# Patient Record
Sex: Female | Born: 1955
Health system: Southern US, Community
[De-identification: ages and names within clinical notes are randomized; demographics above are authoritative.]

## PROBLEM LIST (undated history)

## (undated) DIAGNOSIS — M549 Dorsalgia, unspecified: Secondary | ICD-10-CM

## (undated) DIAGNOSIS — E079 Disorder of thyroid, unspecified: Secondary | ICD-10-CM

## (undated) DIAGNOSIS — T7840XA Allergy, unspecified, initial encounter: Secondary | ICD-10-CM

## (undated) DIAGNOSIS — K219 Gastro-esophageal reflux disease without esophagitis: Secondary | ICD-10-CM

## (undated) DIAGNOSIS — E039 Hypothyroidism, unspecified: Secondary | ICD-10-CM

## (undated) DIAGNOSIS — M8580 Other specified disorders of bone density and structure, unspecified site: Secondary | ICD-10-CM

## (undated) DIAGNOSIS — I10 Essential (primary) hypertension: Secondary | ICD-10-CM

## (undated) DIAGNOSIS — M810 Age-related osteoporosis without current pathological fracture: Secondary | ICD-10-CM

## (undated) DIAGNOSIS — J45909 Unspecified asthma, uncomplicated: Secondary | ICD-10-CM

## (undated) DIAGNOSIS — E785 Hyperlipidemia, unspecified: Secondary | ICD-10-CM

## (undated) HISTORY — DX: Hyperlipidemia, unspecified: E78.5

## (undated) HISTORY — PX: UPPER GASTROINTESTINAL ENDOSCOPY: SHX188

## (undated) HISTORY — PX: COLONOSCOPY: SHX174

## (undated) HISTORY — DX: Disorder of thyroid, unspecified: E07.9

## (undated) HISTORY — DX: Dorsalgia, unspecified: M54.9

## (undated) HISTORY — PX: NASAL SINUS SURGERY: SHX719

## (undated) HISTORY — DX: Other specified disorders of bone density and structure, unspecified site: M85.80

## (undated) HISTORY — DX: Unspecified asthma, uncomplicated: J45.909

## (undated) HISTORY — PX: DILATION AND CURETTAGE OF UTERUS: SHX78

## (undated) HISTORY — DX: Essential (primary) hypertension: I10

## (undated) HISTORY — DX: Allergy, unspecified, initial encounter: T78.40XA

## (undated) HISTORY — DX: Age-related osteoporosis without current pathological fracture: M81.0

## (undated) HISTORY — PX: FRACTURE SURGERY: SHX138

## (undated) HISTORY — DX: Gastro-esophageal reflux disease without esophagitis: K21.9

---

## 1999-10-30 HISTORY — PX: ABDOMINAL HYSTERECTOMY: SHX81

## 2000-02-06 ENCOUNTER — Other Ambulatory Visit: Admission: RE | Admit: 2000-02-06 | Discharge: 2000-02-06 | Payer: Self-pay | Admitting: Family Medicine

## 2000-03-08 ENCOUNTER — Other Ambulatory Visit: Admission: RE | Admit: 2000-03-08 | Discharge: 2000-03-08 | Payer: Self-pay | Admitting: Gynecology

## 2000-04-18 ENCOUNTER — Other Ambulatory Visit: Admission: RE | Admit: 2000-04-18 | Discharge: 2000-04-18 | Payer: Self-pay | Admitting: Gynecology

## 2001-06-11 ENCOUNTER — Other Ambulatory Visit: Admission: RE | Admit: 2001-06-11 | Discharge: 2001-06-11 | Payer: Self-pay | Admitting: Family Medicine

## 2002-09-25 ENCOUNTER — Other Ambulatory Visit: Admission: RE | Admit: 2002-09-25 | Discharge: 2002-09-25 | Payer: Self-pay | Admitting: Family Medicine

## 2003-09-02 ENCOUNTER — Other Ambulatory Visit: Admission: RE | Admit: 2003-09-02 | Discharge: 2003-09-02 | Payer: Self-pay | Admitting: Family Medicine

## 2006-09-12 ENCOUNTER — Other Ambulatory Visit: Admission: RE | Admit: 2006-09-12 | Discharge: 2006-09-12 | Payer: Self-pay | Admitting: Family Medicine

## 2006-10-04 ENCOUNTER — Ambulatory Visit: Payer: Self-pay | Admitting: Gastroenterology

## 2006-10-11 ENCOUNTER — Ambulatory Visit: Payer: Self-pay | Admitting: Gastroenterology

## 2013-02-17 ENCOUNTER — Ambulatory Visit (INDEPENDENT_AMBULATORY_CARE_PROVIDER_SITE_OTHER): Payer: PRIVATE HEALTH INSURANCE | Admitting: Nurse Practitioner

## 2013-02-17 ENCOUNTER — Encounter: Payer: Self-pay | Admitting: Nurse Practitioner

## 2013-02-17 VITALS — BP 131/77 | HR 66 | Temp 98.3°F | Ht 68.5 in | Wt 177.0 lb

## 2013-02-17 DIAGNOSIS — E039 Hypothyroidism, unspecified: Secondary | ICD-10-CM

## 2013-02-17 DIAGNOSIS — E785 Hyperlipidemia, unspecified: Secondary | ICD-10-CM

## 2013-02-17 DIAGNOSIS — I10 Essential (primary) hypertension: Secondary | ICD-10-CM

## 2013-02-17 LAB — COMPLETE METABOLIC PANEL WITH GFR
ALT: 20 U/L (ref 0–35)
AST: 19 U/L (ref 0–37)
Alkaline Phosphatase: 72 U/L (ref 39–117)
CO2: 27 mEq/L (ref 19–32)
Creat: 0.75 mg/dL (ref 0.50–1.10)
GFR, Est African American: >89 mL/min
Sodium: 140 mEq/L (ref 135–145)
Total Bilirubin: 0.4 mg/dL (ref 0.3–1.2)
Total Protein: 7.2 g/dL (ref 6.0–8.3)

## 2013-02-17 LAB — THYROID PANEL WITH TSH
T3 Uptake: 30.9 % (ref 22.5–37.0)
T4, Total: 11.4 ug/dL (ref 5.0–12.5)
TSH: 1.75 u[IU]/mL (ref 0.350–4.500)

## 2013-02-17 MED ORDER — LEVOTHYROXINE SODIUM 50 MCG PO TABS: 50.0000 ug | ORAL_TABLET | Freq: Every day | ORAL | Status: DC

## 2013-02-17 NOTE — Patient Instructions (Signed)

## 2013-02-17 NOTE — Progress Notes (Signed)
Subjective:     Natalie Valencia is a 57 y.o. female.  Chief Complaint: Dyslipidemia Patient presents for evaluation of lipids. Compliance with treatment thus far has been good. Currently on no meds, just trying diet control. A repeat fasting lipid profile was done. The patient does not use medications that may worsen dyslipidemias (corticosteroids, progestins, anabolic steroids, diuretics, beta-blockers, amiodarone, cyclosporine, olanzapine). The patient exercises rarely.  The patient is not known to have coexisting coronary artery disease.   Cardiac Risk Factors Age > 45-female, > 55-female:  YES  +1  Smoking:   NO  Sig. family hx of CHD*:  NO  Hypertension:   YES  +1  Diabetes:   NO  HDL < 35:   NO  HDL > 59:   YES  -1  Total: 1  *- Significant family history of Coronary Heart Disease per National Cholesterol Education Program = Myocardial Infarction or sudden death at less than 91 years old in  father or other 1st-degree female relative, or less than 21 years old in mother or  other 1st-degree female relative.  Hypertension Patient is here for follow-up of elevated blood pressure. She is not exercising and is adherent to a low-salt diet. Blood pressure is well controlled at home. Cardiac symptoms: none. Patient denies chest pain, dyspnea, fatigue, irregular heart beat, palpitations and syncope. Cardiovascular risk factors: dyslipidemia and obesity (BMI >= 30 kg/m2). Use of agents associated with hypertension: none. History of target organ damage: none. Hypothyroidism Natalie Valencia is a 57 y.o. female who presents for follow up of hypothyroidism. Current symptoms: none . Patient denies diarrhea, heat / cold intolerance, nervousness and weight changes. Symptoms have essentially resolved. The following portions of the patient's history were reviewed and updated as appropriate: allergies, current medications, past family history, past medical history, past surgical history and problem  list.  Review of Systems Pertinent items are noted in HPI.    Objective:   BP 131/77  Pulse 66  Temp(Src) 98.3 F (36.8 C) (Oral)  Ht 5' 8.5" (1.74 m)  Wt 177 lb (80.287 kg)  BMI 26.52 kg/m2  General Appearance:    Alert, cooperative, no distress, appears stated age  Head:    Normocephalic, without obvious abnormality, atraumatic  Eyes:    PERRL, conjunctiva/corneas clear, EOM's intact, fundi    benign, both eyes  Ears:    Normal TM's and external ear canals, both ears  Nose:   Nares normal, septum midline, mucosa normal, no drainage    or sinus tenderness  Throat:   Lips, mucosa, and tongue normal; teeth and gums normal  Neck:   Supple, symmetrical, trachea midline, no adenopathy;    thyroid:  no enlargement/tenderness/nodules; no carotid   bruit or JVD  Back:     Symmetric, no curvature, ROM normal, no CVA tenderness  Lungs:     Clear to auscultation bilaterally, respirations unlabored  Chest Wall:    No tenderness or deformity   Heart:    Regular rate and rhythm, S1 and S2 normal, no murmur, rub   or gallop  Breast Exam:    No tenderness, masses, or nipple abnormality  Abdomen:     Soft, non-tender, bowel sounds active all four quadrants,    no masses, no organomegaly        Extremities:   Extremities normal, atraumatic, no cyanosis or edema  Pulses:   2+ and symmetric all extremities  Skin:   Skin color, texture, turgor normal, no rashes or lesions  Lymph nodes:  Cervical, supraclavicular, and axillary nodes normal  Neurologic:   CNII-XII intact, normal strength, sensation and reflexes    throughout      Assessment:     1. Essential hypertension, benign   2. Hyperlipidemia with target LDL less than 100   3. Unspecified hypothyroidism         Plan:      Orders Placed This Encounter  Procedures  . COMPLETE METABOLIC PANEL WITH GFR  . NMR Lipoprofile with Lipids  . Thyroid Panel With TSH        Meds ordered this encounter  Medications  . DISCONTD:  levothyroxine (SYNTHROID, LEVOTHROID) 50 MCG tablet    Sig: Take 50 mcg by mouth daily before breakfast.  . Cholecalciferol (VITAMIN D3) 2000 UNITS TABS    Sig: Take 2,000 Units by mouth daily.  . Calcium-Vitamin D-Vitamin K (CALCIUM + D) 9201987106-40 MG-UNT-MCG CHEW    Sig: Chew by mouth.  . Omega-3 Fatty Acids (FISH OIL) 500 MG CAPS    Sig: Take 1,000 mg by mouth daily.  Marland Kitchen levothyroxine (SYNTHROID, LEVOTHROID) 50 MCG tablet    Sig: Take 1 tablet (50 mcg total) by mouth daily before breakfast.    Dispense:  90 tablet    Refill:  1    Order Specific Question:  Supervising Provider    Answer:  Ernestina Penna [[id number]]  Diet and exercise encouraged Follow up in 6 months. Mary-Margaret Daphine Deutscher, FNP

## 2013-02-18 ENCOUNTER — Telehealth: Payer: Self-pay | Admitting: Nurse Practitioner

## 2013-02-18 LAB — NMR LIPOPROFILE WITH LIPIDS
Cholesterol, Total: 230 mg/dL — ABNORMAL HIGH (ref <200)
HDL Particle Number: 35.6 umol/L (ref >=30.5)
HDL-C: 76 mg/dL (ref >=40)
LDL (calc): 137 mg/dL — ABNORMAL HIGH (ref <100)
LDL Size: 21.1 nm (ref >20.5)
LP-IR Score: <25 (ref <=45)
Large HDL-P: 12.3 umol/L (ref >=4.8)
Triglycerides: 85 mg/dL (ref <150)

## 2013-02-19 NOTE — Telephone Encounter (Signed)
Calling back on labs patient is aware of them

## 2013-08-23 ENCOUNTER — Other Ambulatory Visit: Payer: Self-pay | Admitting: Nurse Practitioner

## 2013-10-02 ENCOUNTER — Encounter: Payer: Self-pay | Admitting: Family Medicine

## 2013-10-02 ENCOUNTER — Ambulatory Visit (INDEPENDENT_AMBULATORY_CARE_PROVIDER_SITE_OTHER): Payer: PRIVATE HEALTH INSURANCE | Admitting: Family Medicine

## 2013-10-02 VITALS — BP 138/72 | HR 76 | Temp 98.8°F | Ht 68.0 in | Wt 178.0 lb

## 2013-10-02 DIAGNOSIS — J209 Acute bronchitis, unspecified: Secondary | ICD-10-CM

## 2013-10-02 MED ORDER — HYDROCOD POLST-CHLORPHEN POLST 10-8 MG/5ML PO LQCR: 5.0000 mL | Freq: Two times a day (BID) | ORAL | Status: DC | PRN

## 2013-10-02 MED ORDER — AZITHROMYCIN 250 MG PO TABS: ORAL_TABLET | ORAL | Status: DC

## 2013-10-02 NOTE — Progress Notes (Signed)
   Subjective:    Patient ID: Natalie Valencia, female    DOB: [birth year], 57 y.o.   MRN: [id number]  HPI This 57 y.o. female presents for evaluation of cough.  She has been coughing A lot at night and she has hx of asthma and she has been using her SABA inhaler More recently.   Review of Systems No chest pain, SOB, HA, dizziness, vision change, N/V, diarrhea, constipation, dysuria, urinary urgency or frequency, myalgias, arthralgias or rash.     Objective:   Physical Exam Vital signs noted  Well developed well nourished female.  HEENT - Head atraumatic Normocephalic                Eyes - PERRLA, Conjuctiva - clear Sclera- Clear EOMI                Ears - EAC's Wnl TM's Wnl Gross Hearing WNL                Nose - Nares patent                 Throat - oropharanx wnl Respiratory - Lungs CTA bilateral Cardiac - RRR S1 and S2 without murmur Breasts- Normal bilateral GI - Abdomen soft Nontender and bowel sounds active x 4 Extremities - No edema. Neuro - Grossly intact.       Assessment & Plan:  Acute bronchitis - Plan: chlorpheniramine-HYDROcodone (TUSSIONEX PENNKINETIC ER) 10-8 MG/5ML LQCR, azithromycin (ZITHROMAX) 250 MG tablet  Deatra Canter FNP

## 2013-10-02 NOTE — Patient Instructions (Signed)
Hypertriglyceridemia  Diet for High blood levels of Triglycerides Most fats in food are triglycerides. Triglycerides in your blood are stored as fat in your body. High levels of triglycerides in your blood may put you at a greater risk for heart disease and stroke.  Normal triglyceride levels are less than 150 mg/dL. Borderline high levels are 150-199 mg/dl. High levels are 200 - 499 mg/dL, and very high triglyceride levels are greater than 500 mg/dL. The decision to treat high triglycerides is generally based on the level. For people with borderline or high triglyceride levels, treatment includes weight loss and exercise. Drugs are recommended for people with very high triglyceride levels. Many people who need treatment for high triglyceride levels have metabolic syndrome. This syndrome is a collection of disorders that often include: insulin resistance, high blood pressure, blood clotting problems, high cholesterol and triglycerides. TESTING PROCEDURE FOR TRIGLYCERIDES  You should not eat 4 hours before getting your triglycerides measured. The normal range of triglycerides is between 10 and 250 milligrams per deciliter (mg/dl). Some people may have extreme levels (1000 or above), but your triglyceride level may be too high if it is above 150 mg/dl, depending on what other risk factors you have for heart disease.  People with high blood triglycerides may also have high blood cholesterol levels. If you have high blood cholesterol as well as high blood triglycerides, your risk for heart disease is probably greater than if you only had high triglycerides. High blood cholesterol is one of the main risk factors for heart disease. CHANGING YOUR DIET  Your weight can affect your blood triglyceride level. If you are more than 20% above your ideal body weight, you may be able to lower your blood triglycerides by losing weight. Eating less and exercising regularly is the best way to combat this. Fat provides more  calories than any other food. The best way to lose weight is to eat less fat. Only 30% of your total calories should come from fat. Less than 7% of your diet should come from saturated fat. A diet low in fat and saturated fat is the same as a diet to decrease blood cholesterol. By eating a diet lower in fat, you may lose weight, lower your blood cholesterol, and lower your blood triglyceride level.  Eating a diet low in fat, especially saturated fat, may also help you lower your blood triglyceride level. Ask your dietitian to help you figure how much fat you can eat based on the number of calories your caregiver has prescribed for you.  Exercise, in addition to helping with weight loss may also help lower triglyceride levels.   Alcohol can increase blood triglycerides. You may need to stop drinking alcoholic beverages.  Too much carbohydrate in your diet may also increase your blood triglycerides. Some complex carbohydrates are necessary in your diet. These may include bread, rice, potatoes, other starchy vegetables and cereals.  Reduce "simple" carbohydrates. These may include pure sugars, candy, honey, and jelly without losing other nutrients. If you have the kind of high blood triglycerides that is affected by the amount of carbohydrates in your diet, you will need to eat less sugar and less high-sugar foods. Your caregiver can help you with this.  Adding 2-4 grams of fish oil (EPA+ DHA) may also help lower triglycerides. Speak with your caregiver before adding any supplements to your regimen. Following the Diet  Maintain your ideal weight. Your caregivers can help you with a diet. Generally, eating less food and getting more   exercise will help you lose weight. Joining a weight control group may also help. Ask your caregivers for a good weight control group in your area.  Eat low-fat foods instead of high-fat foods. This can help you lose weight too.  These foods are lower in fat. Eat MORE of these:    Dried beans, peas, and lentils.  Egg whites.  Low-fat cottage cheese.  Fish.  Lean cuts of meat, such as round, sirloin, rump, and flank (cut extra fat off meat you fix).  Whole grain breads, cereals and pasta.  Skim and nonfat dry milk.  Low-fat yogurt.  Poultry without the skin.  Cheese made with skim or part-skim milk, such as mozzarella, parmesan, farmers', ricotta, or pot cheese. These are higher fat foods. Eat LESS of these:   Whole milk and foods made from whole milk, such as American, blue, cheddar, monterey jack, and swiss cheese  High-fat meats, such as luncheon meats, sausages, knockwurst, bratwurst, hot dogs, ribs, corned beef, ground pork, and regular ground beef.  Fried foods. Limit saturated fats in your diet. Substituting unsaturated fat for saturated fat may decrease your blood triglyceride level. You will need to read package labels to know which products contain saturated fats.  These foods are high in saturated fat. Eat LESS of these:   Fried pork skins.  Whole milk.  Skin and fat from poultry.  Palm oil.  Butter.  Shortening.  Cream cheese.  Bacon.  Margarines and baked goods made from listed oils.  Vegetable shortenings.  Chitterlings.  Fat from meats.  Coconut oil.  Palm kernel oil.  Lard.  Cream.  Sour cream.  Fatback.  Coffee whiteners and non-dairy creamers made with these oils.  Cheese made from whole milk. Use unsaturated fats (both polyunsaturated and monounsaturated) moderately. Remember, even though unsaturated fats are better than saturated fats; you still want a diet low in total fat.  These foods are high in unsaturated fat:   Canola oil.  Sunflower oil.  Mayonnaise.  Almonds.  Peanuts.  Pine nuts.  Margarines made with these oils.  Safflower oil.  Olive oil.  Avocados.  Cashews.  Peanut butter.  Sunflower seeds.  Soybean oil.  Peanut  oil.  Olives.  Pecans.  Walnuts.  Pumpkin seeds. Avoid sugar and other high-sugar foods. This will decrease carbohydrates without decreasing other nutrients. Sugar in your food goes rapidly to your blood. When there is excess sugar in your blood, your liver may use it to make more triglycerides. Sugar also contains calories without other important nutrients.  Eat LESS of these:   Sugar, brown sugar, powdered sugar, jam, jelly, preserves, honey, syrup, molasses, pies, candy, cakes, cookies, frosting, pastries, colas, soft drinks, punches, fruit drinks, and regular gelatin.  Avoid alcohol. Alcohol, even more than sugar, may increase blood triglycerides. In addition, alcohol is high in calories and low in nutrients. Ask for sparkling water, or a diet soft drink instead of an alcoholic beverage. Suggestions for planning and preparing meals   Bake, broil, grill or roast meats instead of frying.  Remove fat from meats and skin from poultry before cooking.  Add spices, herbs, lemon juice or vinegar to vegetables instead of salt, rich sauces or gravies.  Use a non-stick skillet without fat or use no-stick sprays.  Cool and refrigerate stews and broth. Then remove the hardened fat floating on the surface before serving.  Refrigerate meat drippings and skim off fat to make low-fat gravies.  Serve more fish.  Use less butter,   margarine and other high-fat spreads on bread or vegetables.  Use skim or reconstituted non-fat dry milk for cooking.  Cook with low-fat cheeses.  Substitute low-fat yogurt or cottage cheese for all or part of the sour cream in recipes for sauces, dips or congealed salads.  Use half yogurt/half mayonnaise in salad recipes.  Substitute evaporated skim milk for cream. Evaporated skim milk or reconstituted non-fat dry milk can be whipped and substituted for whipped cream in certain recipes.  Choose fresh fruits for dessert instead of high-fat foods such as pies or  cakes. Fruits are naturally low in fat. When Dining Out   Order low-fat appetizers such as fruit or vegetable juice, pasta with vegetables or tomato sauce.  Select clear, rather than cream soups.  Ask that dressings and gravies be served on the side. Then use less of them.  Order foods that are baked, broiled, poached, steamed, stir-fried, or roasted.  Ask for margarine instead of butter, and use only a small amount.  Drink sparkling water, unsweetened tea or coffee, or diet soft drinks instead of alcohol or other sweet beverages. QUESTIONS AND ANSWERS ABOUT OTHER FATS IN THE BLOOD: SATURATED FAT, TRANS FAT, AND CHOLESTEROL What is trans fat? Trans fat is a type of fat that is formed when vegetable oil is hardened through a process called hydrogenation. This process helps makes foods more solid, gives them shape, and prolongs their shelf life. Trans fats are also called hydrogenated or partially hydrogenated oils.  What do saturated fat, trans fat, and cholesterol in foods have to do with heart disease? Saturated fat, trans fat, and cholesterol in the diet all raise the level of LDL "bad" cholesterol in the blood. The higher the LDL cholesterol, the greater the risk for coronary heart disease (CHD). Saturated fat and trans fat raise LDL similarly.  What foods contain saturated fat, trans fat, and cholesterol? High amounts of saturated fat are found in animal products, such as fatty cuts of meat, chicken skin, and full-fat dairy products like butter, whole milk, cream, and cheese, and in tropical vegetable oils such as palm, palm kernel, and coconut oil. Trans fat is found in some of the same foods as saturated fat, such as vegetable shortening, some margarines (especially hard or stick margarine), crackers, cookies, baked goods, fried foods, salad dressings, and other processed foods made with partially hydrogenated vegetable oils. Small amounts of trans fat also occur naturally in some animal  products, such as milk products, beef, and lamb. Foods high in cholesterol include liver, other organ meats, egg yolks, shrimp, and full-fat dairy products. How can I use the new food label to make heart-healthy food choices? Check the Nutrition Facts panel of the food label. Choose foods lower in saturated fat, trans fat, and cholesterol. For saturated fat and cholesterol, you can also use the Percent Daily Value (%DV): 5% DV or less is low, and 20% DV or more is high. (There is no %DV for trans fat.) Use the Nutrition Facts panel to choose foods low in saturated fat and cholesterol, and if the trans fat is not listed, read the ingredients and limit products that list shortening or hydrogenated or partially hydrogenated vegetable oil, which tend to be high in trans fat. POINTS TO REMEMBER:   Discuss your risk for heart disease with your caregivers, and take steps to reduce risk factors.  Change your diet. Choose foods that are low in saturated fat, trans fat, and cholesterol.  Add exercise to your daily routine if   it is not already being done. Participate in physical activity of moderate intensity, like brisk walking, for at least 30 minutes on most, and preferably all days of the week. No time? Break the 30 minutes into three, 10-minute segments during the day.  Stop smoking. If you do smoke, contact your caregiver to discuss ways in which they can help you quit.  Do not use street drugs.  Maintain a normal weight.  Maintain a healthy blood pressure.  Keep up with your blood work for checking the fats in your blood as directed by your caregiver. Document Released: 08/02/2004 Document Revised: 04/15/2012 Document Reviewed: 02/28/2009 [employer] Patient Information 2014 [employer].  

## 2013-10-09 ENCOUNTER — Ambulatory Visit (INDEPENDENT_AMBULATORY_CARE_PROVIDER_SITE_OTHER): Payer: PRIVATE HEALTH INSURANCE

## 2013-10-09 ENCOUNTER — Ambulatory Visit (INDEPENDENT_AMBULATORY_CARE_PROVIDER_SITE_OTHER): Payer: PRIVATE HEALTH INSURANCE | Admitting: Physician Assistant

## 2013-10-09 VITALS — BP 122/73 | HR 68 | Temp 98.0°F | Ht 68.0 in | Wt 177.8 lb

## 2013-10-09 DIAGNOSIS — R062 Wheezing: Secondary | ICD-10-CM

## 2013-10-09 DIAGNOSIS — J209 Acute bronchitis, unspecified: Secondary | ICD-10-CM

## 2013-10-09 DIAGNOSIS — J45909 Unspecified asthma, uncomplicated: Secondary | ICD-10-CM

## 2013-10-09 DIAGNOSIS — J45901 Unspecified asthma with (acute) exacerbation: Secondary | ICD-10-CM

## 2013-10-09 MED ORDER — ALBUTEROL SULFATE HFA 108 (90 BASE) MCG/ACT IN AERS: INHALATION_SPRAY | RESPIRATORY_TRACT | Status: DC

## 2013-10-09 MED ORDER — PREDNISONE 20 MG PO TABS: 20.0000 mg | ORAL_TABLET | Freq: Two times a day (BID) | ORAL | Status: DC

## 2013-10-09 MED ORDER — AZITHROMYCIN 250 MG PO TABS: ORAL_TABLET | ORAL | Status: DC

## 2013-10-09 MED ORDER — HYDROCOD POLST-CHLORPHEN POLST 10-8 MG/5ML PO LQCR: 5.0000 mL | Freq: Two times a day (BID) | ORAL | Status: DC | PRN

## 2013-10-09 NOTE — Patient Instructions (Signed)
Take plain over the counter musinex as directed. Drink plenty of fluids. Use inhaler every 4-6 hours for wheezing and shortness of breath. Take second dose of prednisone early in the evening to prevent trouble sleeping.

## 2013-10-12 NOTE — Progress Notes (Signed)
   Subjective:    Patient ID: Natalie Valencia, female    DOB: 02/29/1956, 57 y.o.   MRN: [id number]  HPI 57 y/o female presents s/p dx of acute bronchitis on 10/02/13. She finished her zpak on 10/07/13 and has had some relief. Still has chest congestion, difficulty taking a deep breath and cough at night.    Review of Systems  Constitutional: Negative.   HENT: Positive for congestion (chest). Negative for ear pain, nosebleeds, postnasal drip, rhinorrhea, sinus pressure, sneezing and sore throat.   Respiratory: Positive for cough (productive), chest tightness and wheezing. Negative for apnea.        Difficulty taking a deep breath  Cardiovascular: Negative.        Objective:   Physical Exam  Nursing note and vitals reviewed. Constitutional: She is oriented to person, place, and time. She appears well-developed and well-nourished. No distress.  HENT:  Right Ear: External ear normal.  Left Ear: External ear normal.  Mouth/Throat: Oropharynx is clear and moist. No oropharyngeal exudate.  Cardiovascular: Normal rate, regular rhythm and normal heart sounds.  Exam reveals no gallop and no friction rub.   No murmur heard. Pulmonary/Chest: Effort normal. No respiratory distress. She has wheezes (mild expiratory bilaterally ). She has no rales. She exhibits no tenderness.  Chest xray negative for acute abnormalities  Neurological: She is alert and oriented to person, place, and time. She has normal reflexes.  Skin: She is not diaphoretic.          Assessment & Plan:  1. Acute bacterial bronchitis: Since Patient is still symptomatic s/p antibiotic treatment, I will continue antibiotic x 5 more days, adding albuterol for relief of wheezing and Prednisone 20mg  BID for bronchiole inflammation. Advised patient to take plain OTC musinex and drink plenty of non-caffienated  fluids. Also refilled tussinex for relief of cough at bedtime. I will reassess in 1 wk.

## 2013-10-16 ENCOUNTER — Ambulatory Visit: Payer: PRIVATE HEALTH INSURANCE | Admitting: Physician Assistant

## 2013-11-23 ENCOUNTER — Other Ambulatory Visit: Payer: Self-pay | Admitting: Nurse Practitioner

## 2014-02-22 ENCOUNTER — Other Ambulatory Visit: Payer: Self-pay | Admitting: Family Medicine

## 2014-02-23 NOTE — Telephone Encounter (Signed)
Last seen 10/02/13  B [place]  Last thyroid level 02/17/13  Requesting a 90 day supply

## 2014-06-28 ENCOUNTER — Other Ambulatory Visit: Payer: Self-pay | Admitting: Family Medicine

## 2014-06-29 NOTE — Telephone Encounter (Signed)
no more refills without being seen  

## 2014-06-29 NOTE — Telephone Encounter (Signed)
Patient last seen in office on chronic follow up was 02-17-13. Two acute visits in 12-14. Please advise on 90 day refill

## 2014-10-28 ENCOUNTER — Other Ambulatory Visit: Payer: Self-pay | Admitting: Nurse Practitioner

## 2014-10-28 NOTE — Telephone Encounter (Signed)
No thyroid labs since 01/2013

## 2014-10-29 NOTE — Telephone Encounter (Signed)
no more refills without being seen Needs labs 

## 2014-12-08 ENCOUNTER — Encounter: Payer: Self-pay | Admitting: Internal Medicine

## 2015-01-31 ENCOUNTER — Ambulatory Visit (INDEPENDENT_AMBULATORY_CARE_PROVIDER_SITE_OTHER): Payer: BLUE CROSS/BLUE SHIELD | Admitting: Internal Medicine

## 2015-01-31 ENCOUNTER — Encounter: Payer: Self-pay | Admitting: Internal Medicine

## 2015-01-31 VITALS — BP 138/84 | HR 68 | Ht 67.0 in | Wt 182.0 lb

## 2015-01-31 DIAGNOSIS — R1310 Dysphagia, unspecified: Secondary | ICD-10-CM | POA: Diagnosis not present

## 2015-01-31 NOTE — Progress Notes (Signed)
Patient ID: Natalie PerfectConnie Valencia, female   DOB: [birth year], 59 y.o.   MRN: [id number] HPI: Natalie Valencia is a 59 year old female with a past medical history of hypothyroidism, mild asthma who is seen in consultation at the request of Bennie PieriniMary Margaret Martin, NP to evaluate solid food dysphagia. She is here alone today. She reports over the last 6 months noting intermittent dysphagia. This is for solid food only such as meats and breads. Food feels like it sticks in her mid chest and usually slowly passes over several minutes. One episode sounds like more transient obstruction where liquid would not go down and was vomited up. When this occurs she has chest discomfort also transiently. No weight loss. No nausea or vomiting. No early satiety. No abdominal pain. She reports occasional nocturnal heartburn and regurgitation but 1 day a week or less. She is not taking any acid suppression medication. Bowel movements a been regular without blood in her stool or melena. No diarrhea or constipation. She denies a family history of GI tract malignancy or IBD. She had a screening colonoscopy performed in December 2017 by Dr. Jarold MottoPatterson which was normal   Past Medical History  Diagnosis Date  . Thyroid disease   . Osteopenia   . Back pain   . Asthma     Past Surgical History  Procedure Laterality Date  . Abdominal hysterectomy  2001    Outpatient Prescriptions Prior to Visit  Medication Sig Dispense Refill  . albuterol (PROVENTIL HFA;VENTOLIN HFA) 108 (90 BASE) MCG/ACT inhaler 2 puffs every 4-6 hours for wheezing 1 Inhaler 0  . Cetirizine HCl (ZYRTEC ALLERGY PO) Take 10 mg by mouth daily.     . Cholecalciferol (VITAMIN D3) 2000 UNITS TABS Take 2,000 Units by mouth daily.    Marland Kitchen. levothyroxine (SYNTHROID, LEVOTHROID) 50 MCG tablet TAKE 1 TABLET BY MOUTH DAILY  BEFORE BREAKFAST 90 tablet 0  . azithromycin (ZITHROMAX) 250 MG tablet Take 2 tablets on day 1, then 1 tablet x 4 days 6 each 0  . Calcium-Vitamin D-Vitamin K  (CALCIUM + D) [phone] MG-UNT-MCG CHEW Chew by mouth.    . chlorpheniramine-HYDROcodone (TUSSIONEX PENNKINETIC ER) 10-8 MG/5ML LQCR Take 5 mLs by mouth every 12 (twelve) hours as needed for cough. 120 mL 0  . Omega-3 Fatty Acids (FISH OIL) 500 MG CAPS Take 1,000 mg by mouth daily.    . predniSONE (DELTASONE) 20 MG tablet Take 1 tablet (20 mg total) by mouth 2 (two) times daily with a meal. 14 tablet 0   No facility-administered medications prior to visit.    No Known Allergies  Family History  Problem Relation Age of Onset  . Cancer Mother     female region- unknown  . Diabetes Father   . Stroke Paternal Grandmother   . Alcoholism Maternal Grandfather     History  Substance Use Topics  . Smoking status: Never Smoker   . Smokeless tobacco: Never Used  . Alcohol Use: No    ROS: As per history of present illness, otherwise negative  BP 138/84 mmHg  Pulse 68  Ht 5\' 7"  (1.702 m)  Wt 182 lb (82.555 kg)  BMI 28.50 kg/m2 Constitutional: Well-developed and well-nourished. No distress. HEENT: Normocephalic and atraumatic. Oropharynx is clear and moist. No oropharyngeal exudate. Conjunctivae are normal.  No scleral icterus. Neck: Neck supple. Trachea midline. Cardiovascular: Normal rate, regular rhythm and intact distal pulses. No M/R/G Pulmonary/chest: Effort normal and breath sounds normal. No wheezing, rales or rhonchi. Abdominal: Soft, nontender, nondistended. Bowel sounds  active throughout. There are no masses palpable. No hepatosplenomegaly. Extremities: no clubbing, cyanosis, or edema Lymphadenopathy: No cervical adenopathy noted. Neurological: Alert and oriented to person place and time. Skin: Skin is warm and dry. No rashes noted. Psychiatric: Normal mood and affect. Behavior is normal.   ASSESSMENT/PLAN:  59 year old female with a past medical history of hypothyroidism, mild asthma who is seen in consultation at the request of Bennie Pierini, NP to evaluate  solid food dysphagia.   1. Dysphagia -- solid food.  We discussed possibly stricture versus esophagitis though she is not having heartburn very often. I recommended upper endoscopy with possible dilation. We discussed the risks and benefits and she is agreeable to proceed.    2. CRC screening -- repeat colonoscopy recommended December 2017      QM:[id number] [place], Fnp [address] [place], [place] [address]

## 2015-01-31 NOTE — Patient Instructions (Addendum)
You have been scheduled for an endoscopy. Please follow written instructions given to you at your visit today. If you use inhalers (even only as needed), please bring them with you on the day of your procedure. Your physician has requested that you go to www.startemmi.com and enter the access code given to you at your visit today. This web site gives a general overview about your procedure. However, you should still follow specific instructions given to you by our office regarding your preparation for the procedure.  You will be due for a recall colonoscopy in 09/2016. We will send you a reminder in the mail when it gets closer to that time.

## 2015-02-09 ENCOUNTER — Encounter: Payer: Self-pay | Admitting: Internal Medicine

## 2015-02-09 ENCOUNTER — Ambulatory Visit (AMBULATORY_SURGERY_CENTER): Payer: BLUE CROSS/BLUE SHIELD | Admitting: Internal Medicine

## 2015-02-09 VITALS — BP 135/72 | HR 72 | Temp 98.9°F | Resp 16 | Ht 68.0 in | Wt 182.0 lb

## 2015-02-09 DIAGNOSIS — K2970 Gastritis, unspecified, without bleeding: Secondary | ICD-10-CM

## 2015-02-09 DIAGNOSIS — K2950 Unspecified chronic gastritis without bleeding: Secondary | ICD-10-CM | POA: Diagnosis not present

## 2015-02-09 DIAGNOSIS — R1310 Dysphagia, unspecified: Secondary | ICD-10-CM | POA: Diagnosis present

## 2015-02-09 DIAGNOSIS — K2990 Gastroduodenitis, unspecified, without bleeding: Secondary | ICD-10-CM

## 2015-02-09 DIAGNOSIS — K222 Esophageal obstruction: Secondary | ICD-10-CM

## 2015-02-09 MED ORDER — PANTOPRAZOLE SODIUM 40 MG PO TBEC: 40.0000 mg | DELAYED_RELEASE_TABLET | ORAL | Status: DC

## 2015-02-09 MED ORDER — SODIUM CHLORIDE 0.9 % IV SOLN: 500.0000 mL | INTRAVENOUS | Status: DC

## 2015-02-09 NOTE — Patient Instructions (Signed)
YOU HAD AN ENDOSCOPIC PROCEDURE TODAY AT THE [facility]:   Refer to the procedure report that was given to you for any specific questions about what was found during the examination.  If the procedure report does not answer your questions, please call your gastroenterologist to clarify.  If you requested that your care partner not be given the details of your procedure findings, then the procedure report has been included in a sealed envelope for you to review at your convenience later.  YOU SHOULD EXPECT: Some feelings of bloating in the abdomen. Passage of more gas than usual.  Walking can help get rid of the air that was put into your GI tract during the procedure and reduce the bloating. If you had a lower endoscopy (such as a colonoscopy or flexible sigmoidoscopy) you may notice spotting of blood in your stool or on the toilet paper. If you underwent a bowel prep for your procedure, you may not have a normal bowel movement for a few days.  Please Note:  You might notice some irritation and congestion in your nose or some drainage.  This is from the oxygen used during your procedure.  There is no need for concern and it should clear up in a day or so.  SYMPTOMS TO REPORT IMMEDIATELY:    Following upper endoscopy (EGD)  Vomiting of blood or coffee ground material  New chest pain or pain under the shoulder blades  Painful or persistently difficult swallowing  New shortness of breath  Fever of 100F or higher  Black, tarry-looking stools  For urgent or emergent issues, a gastroenterologist can be reached at any hour by calling [phone].   DIET:  Drink plenty of fluids but you should avoid alcoholic beverages for 24 hours.  Please follow the dilatation diet the rest of the day.  Handout was given.  ACTIVITY:  You should plan to take it easy for the rest of today and you should NOT DRIVE or use heavy machinery until tomorrow (because of the sedation medicines used during the  test).    FOLLOW UP: Our staff will call the number listed on your records the next business day following your procedure to check on you and address any questions or concerns that you may have regarding the information given to you following your procedure. If we do not reach you, we will leave a message.  However, if you are feeling well and you are not experiencing any problems, there is no need to return our call.  We will assume that you have returned to your regular daily activities without incident.  If any biopsies were taken you will be contacted by phone or by letter within the next 1-3 weeks.  Please call us at [phone] if you have not heard about the biopsies in 3 weeks.    SIGNATURES/CONFIDENTIALITY: You and/or your care partner have signed paperwork which will be entered into your electronic medical record.  These signatures attest to the fact that that the information above on your After Visit Summary has been reviewed and is understood.  Full responsibility of the confidentiality of this discharge information lies with you and/or your care-partner.    Handouts were given to your care partner on a dilatation diet, GERD/hiatal hernia, and gastritis.  Rx sent to [facility] You may resume your current medications today.  [facility] [place]. Await biopsy results. Please call if any questions or concerns.

## 2015-02-09 NOTE — Progress Notes (Signed)
A/ox3 pleased with MAC, report to Annette RN 

## 2015-02-09 NOTE — Progress Notes (Signed)
No problems noted in the recovery room. maw 

## 2015-02-09 NOTE — Op Note (Signed)
[facility] [address] [place], [id number]   ENDOSCOPY PROCEDURE REPORT  PATIENT: Natalie Valencia, Natalie Valencia  MR#: [id number] BIRTHDATE: 08/13/1956 , 59  yrs. old GENDER: female ENDOSCOPIST: Beverley FiedlerJay M Pyrtle, MD REFERRED BY:  Rudi Heaponald Moore, M.D. PROCEDURE DATE:  02/09/2015 PROCEDURE:  EGD w/ balloon dilation and EGD w/ biopsy ASA CLASS:     Class II INDICATIONS:  dysphagia. MEDICATIONS: Monitored anesthesia care and Propofol 200 mg IV TOPICAL ANESTHETIC: none  DESCRIPTION OF PROCEDURE: After the risks benefits and alternatives of the procedure were thoroughly explained, informed consent was obtained.  The LB [id number] endoscope was introduced through the mouth and advanced to the second portion of the duodenum , Without limitations.  The instrument was slowly withdrawn as the mucosa was fully examined.  ESOPHAGUS: A Schatzki ring was found at the gastroesophageal junction.  Using a TTS-balloon the stricture was dilated up to 15mm.  The balloon was held inflated for 60 seconds.  Following this dilation, there was a small mucosal rent with minimal heme.  STOMACH: A 2-3 cm hiatal hernia was noted.   Moderate erosive gastritis (inflammation) was found in the prepyloric region of the stomach and gastric antrum.  Cold forcep biopsies were taken at the gastric body, antrum and angularis to evaluate for h.  pylori.  DUODENUM: Mild duodenal inflammation was found in the duodenal bulb. The duodenal mucosa showed no abnormalities in the 2nd part of the duodenum.  Retroflexed views revealed a hiatal hernia.     The scope was then withdrawn from the patient and the procedure completed.  COMPLICATIONS: There were no immediate complications.  ENDOSCOPIC IMPRESSION: 1.   Schatzki ring was found at the gastroesophageal junction; balloon dilation to 15 mm 2.   3 cm hiatal hernia 3.   Erosive gastritis (inflammation) was found in the prepyloric region of the stomach and  gastric antrum 4.   Duodenal inflammation was found in the duodenal bulb 5.   The duodenal mucosa showed no abnormalities in the 2nd part of the duodenum  RECOMMENDATIONS: 1.  Begin pantoprazole 40 mg once daily (30 min before breakfast) 2.  Await biopsy results 3.  Follow-up of helicobacter pylori status, treat if indicated   eSigned:  Beverley FiedlerJay M Pyrtle, MD 02/09/2015 12:17 PM    CC:The Patient and Rudi Heaponald Moore, MD  PATIENT NAME:  Natalie Valencia, Natalie Valencia MR#: [id number]

## 2015-02-09 NOTE — Progress Notes (Signed)
Called to room to assist during endoscopic procedure.  Patient ID and intended procedure confirmed with present staff. Received instructions for my participation in the procedure from the performing physician.  

## 2015-02-10 ENCOUNTER — Telehealth: Payer: Self-pay | Admitting: *Deleted

## 2015-02-10 NOTE — Telephone Encounter (Signed)
  Follow up Call-  Call back number 02/09/2015  Post procedure Call Back phone  # [phone]  Permission to leave phone message Yes     No answer at # given.  Left message on VM.

## 2015-02-14 ENCOUNTER — Encounter: Payer: Self-pay | Admitting: Internal Medicine

## 2015-07-05 ENCOUNTER — Other Ambulatory Visit: Payer: PRIVATE HEALTH INSURANCE | Admitting: Nurse Practitioner

## 2015-07-06 ENCOUNTER — Encounter: Payer: Self-pay | Admitting: Nurse Practitioner

## 2015-07-28 ENCOUNTER — Encounter: Payer: Self-pay | Admitting: Physician Assistant

## 2015-07-28 ENCOUNTER — Ambulatory Visit (INDEPENDENT_AMBULATORY_CARE_PROVIDER_SITE_OTHER): Payer: BLUE CROSS/BLUE SHIELD

## 2015-07-28 ENCOUNTER — Ambulatory Visit (INDEPENDENT_AMBULATORY_CARE_PROVIDER_SITE_OTHER): Payer: BLUE CROSS/BLUE SHIELD | Admitting: Physician Assistant

## 2015-07-28 VITALS — BP 156/78 | HR 72 | Temp 97.4°F | Ht 68.0 in | Wt 174.0 lb

## 2015-07-28 DIAGNOSIS — R062 Wheezing: Secondary | ICD-10-CM | POA: Diagnosis not present

## 2015-07-28 DIAGNOSIS — J209 Acute bronchitis, unspecified: Secondary | ICD-10-CM

## 2015-07-28 DIAGNOSIS — R059 Cough, unspecified: Secondary | ICD-10-CM

## 2015-07-28 DIAGNOSIS — R05 Cough: Secondary | ICD-10-CM

## 2015-07-28 MED ORDER — METHYLPREDNISOLONE ACETATE 80 MG/ML IJ SUSP
80.0000 mg | Freq: Once | INTRAMUSCULAR | Status: AC
  Administered 2015-07-28: 80 mg via INTRAMUSCULAR

## 2015-07-28 MED ORDER — LEVOFLOXACIN 500 MG PO TABS: 500.0000 mg | ORAL_TABLET | Freq: Every day | ORAL | Status: DC

## 2015-07-28 MED ORDER — HYDROCODONE-HOMATROPINE 5-1.5 MG/5ML PO SYRP: 5.0000 mL | ORAL_SOLUTION | Freq: Three times a day (TID) | ORAL | Status: DC | PRN

## 2015-07-28 MED ORDER — PREDNISONE 10 MG (21) PO TBPK: ORAL_TABLET | ORAL | Status: DC

## 2015-07-28 NOTE — Progress Notes (Signed)
   Subjective:    Patient ID: Natalie Valencia, female    DOB: 07-20-1956, 59 y.o.   MRN: [id number]  HPI 59 y/o female with h/o asthma presents with c/o cough and chest congestion, increased SOB x 1.5 weeks. She has tried musinex , proair and albuterol with mild relief.   She takes allergy shots every 2 weeks   Review of Systems  Constitutional: Negative.   HENT: Positive for congestion (chest ). Negative for sore throat.   Respiratory: Positive for cough, chest tightness and shortness of breath.   Cardiovascular: Negative.        Objective:   Physical Exam  Constitutional: She is oriented to person, place, and time. She appears well-developed and well-nourished. No distress.  HENT:  Head: Normocephalic.  Cardiovascular: Normal rate, regular rhythm and intact distal pulses.  Exam reveals no gallop and no friction rub.   No murmur heard. Pulmonary/Chest: Effort normal. No respiratory distress. She has rales.  Severe generalized inspiratory and expiratory wheezing throughout on R and L lobes anterior and posterior. Positive for rhonchi.   Neurological: She is alert and oriented to person, place, and time.  Skin: She is not diaphoretic.  Psychiatric: She has a normal mood and affect. Her behavior is normal. Judgment and thought content normal.  Nursing note and vitals reviewed.         Assessment & Plan:  1. Wheezing -  - DG Chest 2 View; Future - levofloxacin (LEVAQUIN) 500 MG tablet; Take 1 tablet (500 mg total) by mouth daily.  Dispense: 7 tablet; Refill: 0 - predniSONE (STERAPRED UNI-PAK 21 TAB) 10 MG (21) TBPK tablet; 6 pills PO on day 1, 5 on day 2, 4 on day 3, 3 on day 4, 2 on day 5, 1 on day 6  Dispense: 21 tablet; Refill: 0 - methylPREDNISolone acetate (DEPO-MEDROL) injection 80 mg; Inject 1 mL (80 mg total) into the muscle once.  2. Acute bronchitis, unspecified organism - otc plain musinex - Proventil as prescribed by Dr. Mikey Bussing - albuterol every 6 hours  -  levofloxacin (LEVAQUIN) 500 MG tablet; Take 1 tablet (500 mg total) by mouth daily.  Dispense: 7 tablet; Refill: 0 - predniSONE (STERAPRED UNI-PAK 21 TAB) 10 MG (21) TBPK tablet; 6 pills PO on day 1, 5 on day 2, 4 on day 3, 3 on day 4, 2 on day 5, 1 on day 6  Dispense: 21 tablet; Refill: 0 - methylPREDNISolone acetate (DEPO-MEDROL) injection 80 mg; Inject 1 mL (80 mg total) into the muscle once.  3. Cough  - HYDROcodone-homatropine (HYCODAN) 5-1.5 MG/5ML syrup; Take 5 mLs by mouth every 8 (eight) hours as needed for cough.  Dispense: 120 mL; Refill: 0   RTC 1 week   Tiffany A. Chauncey Reading PA-C

## 2015-07-28 NOTE — Patient Instructions (Signed)
Continue to use over the counter plain musinex Use inhaler every 6 hours until follow up   Avoid smoke

## 2015-08-05 ENCOUNTER — Ambulatory Visit (INDEPENDENT_AMBULATORY_CARE_PROVIDER_SITE_OTHER): Payer: BLUE CROSS/BLUE SHIELD | Admitting: Pediatrics

## 2015-08-05 ENCOUNTER — Encounter: Payer: Self-pay | Admitting: Pediatrics

## 2015-08-05 VITALS — BP 137/75 | HR 67 | Temp 97.4°F | Ht 68.0 in | Wt 174.4 lb

## 2015-08-05 DIAGNOSIS — J45909 Unspecified asthma, uncomplicated: Secondary | ICD-10-CM | POA: Insufficient documentation

## 2015-08-05 DIAGNOSIS — J45901 Unspecified asthma with (acute) exacerbation: Secondary | ICD-10-CM

## 2015-08-05 NOTE — Progress Notes (Signed)
    Subjective:    Patient ID: Natalie Valencia, female    DOB: August 17, 1956, 59 y.o.   MRN: [id number]  CC: f/u breathing  HPI: Natalie Valencia is a 59 y.o. female presenting on 08/05/2015 for Follow-up  Treated for acute bronchitis/COPD/pneumonia one week ago. Finished antibiotics Couldn't take prednisone because she threw it up each day. Still using proair and symbicort  H/o asthma, on allergy shots for two years, allergic to dust, dust mites.  Feels like breathing is doing better. No fevers.  Relevant past medical, surgical, family and social history reviewed and updated as indicated. Interim medical history since our last visit reviewed. Allergies and medications reviewed and updated.   ROS: Per HPI unless specifically indicated above  Past Medical History Patient Active Problem List   Diagnosis Date Noted  . Asthma with acute exacerbation 08/05/2015  . Essential hypertension, benign 02/17/2013  . Hyperlipidemia with target LDL less than 100 02/17/2013  . Unspecified hypothyroidism 02/17/2013    Current Outpatient Prescriptions  Medication Sig Dispense Refill  . albuterol (PROVENTIL HFA;VENTOLIN HFA) 108 (90 BASE) MCG/ACT inhaler 2 puffs every 4-6 hours for wheezing 1 Inhaler 0  . AMBULATORY NON FORMULARY MEDICATION Allergy Shots Take 1 injection every 2 weeks    . aspirin 81 MG tablet Take 81 mg by mouth daily.    . budesonide-formoterol (SYMBICORT) 160-4.5 MCG/ACT inhaler Inhale 2 puffs into the lungs as needed.    . Cetirizine HCl (ZYRTEC ALLERGY PO) Take 10 mg by mouth daily.     Marland Kitchen HYDROcodone-homatropine (HYCODAN) 5-1.5 MG/5ML syrup Take 5 mLs by mouth every 8 (eight) hours as needed for cough. 120 mL 0  . Ibuprofen 200 MG CAPS Take 1 capsule by mouth as needed.    . montelukast (SINGULAIR) 10 MG tablet Take 10 mg by mouth at bedtime.    . pantoprazole (PROTONIX) 40 MG tablet Take 1 tablet (40 mg total) by mouth 1 day or 1 dose. Take one 30 minutes before breakfast on  an empty stomach 90 tablet 3   No current facility-administered medications for this visit.       Objective:    BP 137/75 mmHg  Pulse 67  Temp(Src) 97.4 F (36.3 C) (Oral)  Ht  (1.727 m)  Wt 174 lb 6.4 oz (79.107 kg)  BMI 26.52 kg/m2  Wt Readings from Last 3 Encounters:  08/05/15 174 lb 6.4 oz (79.107 kg)  07/28/15 174 lb (78.926 kg)  02/09/15 182 lb (82.555 kg)     Gen: NAD, alert, cooperative with exam, NCAT EYES: EOMI, no scleral injection or icterus ENT:  TMs without erythema, OP without erythema, no sinus tenderness LYMPH: no cervical LAD CV: NRRR, normal S1/S2, no murmur, DP pulses 2+ b/l Resp: scattered inspiratory and expiratory wheezes, normal WOB, no rales Ext: No edema, warm Neuro: Alert and oriented    Assessment & Plan:    Natalie Valencia was seen today for follow-up. Doing better, not able to take prednisone. Breathing better. Continue symbicort daily even when well, albuterol TID for next few days.  Diagnoses and all orders for this visit:  Asthma with acute exacerbation, unspecified asthma severity   Follow up plan: Return in about 3 months (around 11/05/2015) for asthma.  Rex Kras, MD [facility] 08/05/2015, 8:27 AM

## 2015-09-07 LAB — HM MAMMOGRAPHY: HM Mammogram: NEGATIVE

## 2015-09-12 ENCOUNTER — Encounter: Payer: Self-pay | Admitting: *Deleted

## 2015-11-11 ENCOUNTER — Ambulatory Visit (INDEPENDENT_AMBULATORY_CARE_PROVIDER_SITE_OTHER): Payer: BLUE CROSS/BLUE SHIELD | Admitting: Pediatrics

## 2015-11-11 ENCOUNTER — Encounter: Payer: Self-pay | Admitting: Pediatrics

## 2015-11-11 VITALS — BP 133/72 | HR 75 | Temp 97.4°F | Ht 68.0 in | Wt 174.6 lb

## 2015-11-11 DIAGNOSIS — Z1211 Encounter for screening for malignant neoplasm of colon: Secondary | ICD-10-CM

## 2015-11-11 DIAGNOSIS — Z0000 Encounter for general adult medical examination without abnormal findings: Secondary | ICD-10-CM

## 2015-11-11 DIAGNOSIS — E039 Hypothyroidism, unspecified: Secondary | ICD-10-CM

## 2015-11-11 NOTE — Progress Notes (Signed)
Subjective:    Patient ID: Natalie Valencia, female    DOB: [birth year], 60 y.o.   MRN: [id number]  CC: Annual Exam   HPI: Natalie Valencia is a 60 y.o. female presenting for Annual Exam  Had sinus surgery Sep 26, 2015 Now blowing out a lot of yellow green, using neti pot 5-6 times  About a week and a half having more drainage Breathing has been fine No fevers No facial pain or pressure One month ago sinus secretions got more clear No sore throat/no cough or URI symptoms  Pap smear due this summer H/o esophageal stricture requiring strecthing  Asthma: has gone days without albuteorl, usually even when well using albuterol once a week   Depression screen [facility] 2/9 11/11/2015 08/05/2015  Decreased Interest 0 0  Down, Depressed, Hopeless 0 0  PHQ - 2 Score 0 0     Relevant past medical, surgical, family and social history reviewed and updated as indicated. Interim medical history since our last visit reviewed. Allergies and medications reviewed and updated.    ROS: All systems negative other than what is in HPI  History  Smoking status  . Never Smoker   Smokeless tobacco  . Never Used    Past Medical History Patient Active Problem List   Diagnosis Date Noted  . Asthma with acute exacerbation 08/05/2015  . Essential hypertension, benign 02/17/2013  . Hyperlipidemia with target LDL less than 100 02/17/2013  . Unspecified hypothyroidism 02/17/2013    Current Outpatient Prescriptions  Medication Sig Dispense Refill  . albuterol (PROVENTIL HFA;VENTOLIN HFA) 108 (90 BASE) MCG/ACT inhaler 2 puffs every 4-6 hours for wheezing 1 Inhaler 0  . AMBULATORY NON FORMULARY MEDICATION Allergy Shots Take 1 injection every 2 weeks    . aspirin 81 MG tablet Take 81 mg by mouth daily.    . budesonide-formoterol (SYMBICORT) 160-4.5 MCG/ACT inhaler Inhale 2 puffs into the lungs as needed.    . Cetirizine HCl (ZYRTEC ALLERGY PO) Take 10 mg by mouth daily.     . cholecalciferol (VITAMIN  D) 1000 units tablet Take 1,000 Units by mouth daily.    . Ibuprofen 200 MG CAPS Take 1 capsule by mouth as needed.    . montelukast (SINGULAIR) 10 MG tablet Take 10 mg by mouth at bedtime.    . pantoprazole (PROTONIX) 40 MG tablet Take 1 tablet (40 mg total) by mouth 1 day or 1 dose. Take one 30 minutes before breakfast on an empty stomach 90 tablet 3  . vitamin B-12 (CYANOCOBALAMIN) 1000 MCG tablet Take 1,000 mcg by mouth daily.     No current facility-administered medications for this visit.       Objective:    BP 133/72 mmHg  Pulse 75  Temp(Src) 97.4 F (36.3 C) (Oral)  Ht 5' 8" (1.727 m)  Wt 174 lb 9.6 oz (79.198 kg)  BMI 26.55 kg/m2  Wt Readings from Last 3 Encounters:  11/11/15 174 lb 9.6 oz (79.198 kg)  08/05/15 174 lb 6.4 oz (79.107 kg)  07/28/15 174 lb (78.926 kg)     Gen: NAD, alert, cooperative with exam, NCAT EYES: EOMI, no scleral injection or icterus ENT:  TMs pearly gray b/l, OP without erythema LYMPH: no cervical LAD CV: NRRR, normal S1/S2, no murmur, distal pulses 2+ b/l Resp: CTABL, no wheezes, normal WOB Abd: +BS, soft, NTND. no guarding or organomegaly Ext: No edema, warm Neuro: Alert and oriented, strength equal b/l UE and LE, coordination grossly normal MSK: normal muscle bulk  Assessment & Plan:    [place] was seen today for annual exam. Overall doing well.   Diagnoses and all orders for this visit:  Encounter for preventive health examination -     CMP14+EGFR -     CBC -     Lipid panel  Screen for colon cancer -     Ambulatory referral to Gastroenterology  Hypothyroidism, unspecified hypothyroidism type Has been off of thyroid medicine for 6 months. -     Thyroid Panel With TSH    Follow up plan: Return in about 6 months (around 05/10/2016) for pap smear.  Assunta Found, MD [facility] 11/11/2015, 10:40 AM

## 2015-11-12 LAB — CMP14+EGFR
A/G RATIO: 1.3 (ref 1.1–2.5)
ALBUMIN: 4 g/dL (ref 3.5–5.5)
ALK PHOS: 86 IU/L (ref 39–117)
ALT: 15 IU/L (ref 0–32)
AST: 19 IU/L (ref 0–40)
BILIRUBIN TOTAL: 0.3 mg/dL (ref 0.0–1.2)
BUN / CREAT RATIO: 13 (ref 9–23)
BUN: 10 mg/dL (ref 6–24)
CO2: 25 mmol/L (ref 18–29)
Calcium: 9.6 mg/dL (ref 8.7–10.2)
Chloride: 103 mmol/L (ref 96–106)
Creatinine, Ser: 0.79 mg/dL (ref 0.57–1.00)
GFR calc Af Amer: 95 mL/min/{1.73_m2} (ref >59)
GFR calc non Af Amer: 82 mL/min/{1.73_m2} (ref >59)
GLOBULIN, TOTAL: 3.2 g/dL (ref 1.5–4.5)
Glucose: 86 mg/dL (ref 65–99)
POTASSIUM: 4.2 mmol/L (ref 3.5–5.2)
SODIUM: 144 mmol/L (ref 134–144)
Total Protein: 7.2 g/dL (ref 6.0–8.5)

## 2015-11-12 LAB — THYROID PANEL WITH TSH
Free Thyroxine Index: 2.3 (ref 1.2–4.9)
T3 UPTAKE RATIO: 26 % (ref 24–39)
T4 TOTAL: 9 ug/dL (ref 4.5–12.0)
TSH: 4.97 u[IU]/mL — AB (ref 0.450–4.500)

## 2015-11-12 LAB — CBC
Hematocrit: 37.8 % (ref 34.0–46.6)
Hemoglobin: 12.5 g/dL (ref 11.1–15.9)
MCH: 29.3 pg (ref 26.6–33.0)
MCHC: 33.1 g/dL (ref 31.5–35.7)
MCV: 89 fL (ref 79–97)
PLATELETS: 401 10*3/uL — AB (ref 150–379)
RBC: 4.27 x10E6/uL (ref 3.77–5.28)
RDW: 13.1 % (ref 12.3–15.4)
WBC: 8.9 10*3/uL (ref 3.4–10.8)

## 2015-11-12 LAB — LIPID PANEL
CHOLESTEROL TOTAL: 203 mg/dL — AB (ref 100–199)
Chol/HDL Ratio: 2.8 ratio units (ref 0.0–4.4)
HDL: 72 mg/dL (ref >39)
LDL Calculated: 115 mg/dL — ABNORMAL HIGH (ref 0–99)
Triglycerides: 81 mg/dL (ref 0–149)
VLDL CHOLESTEROL CAL: 16 mg/dL (ref 5–40)

## 2015-11-17 ENCOUNTER — Telehealth: Payer: Self-pay | Admitting: Pediatrics

## 2015-11-29 ENCOUNTER — Telehealth: Payer: Self-pay | Admitting: Pediatrics

## 2015-11-29 NOTE — Telephone Encounter (Signed)
X °

## 2015-11-30 ENCOUNTER — Telehealth: Payer: Self-pay | Admitting: Pediatrics

## 2015-11-30 MED ORDER — LEVOTHYROXINE SODIUM 50 MCG PO TABS: 50.0000 ug | ORAL_TABLET | Freq: Every day | ORAL | Status: DC

## 2015-11-30 NOTE — Telephone Encounter (Signed)
Please see previous telephone encounter.

## 2015-12-05 NOTE — Telephone Encounter (Signed)
Patient aware of results on 2/1

## 2016-02-11 ENCOUNTER — Ambulatory Visit (INDEPENDENT_AMBULATORY_CARE_PROVIDER_SITE_OTHER): Payer: BLUE CROSS/BLUE SHIELD | Admitting: Family Medicine

## 2016-02-11 ENCOUNTER — Encounter: Payer: Self-pay | Admitting: Family Medicine

## 2016-02-11 VITALS — BP 131/78 | HR 66 | Temp 97.6°F | Ht 68.0 in | Wt 171.0 lb

## 2016-02-11 DIAGNOSIS — J4541 Moderate persistent asthma with (acute) exacerbation: Secondary | ICD-10-CM | POA: Diagnosis not present

## 2016-02-11 MED ORDER — AZITHROMYCIN 250 MG PO TABS: ORAL_TABLET | ORAL | Status: DC

## 2016-02-11 MED ORDER — METHYLPREDNISOLONE ACETATE 80 MG/ML IJ SUSP
80.0000 mg | Freq: Once | INTRAMUSCULAR | Status: AC
  Administered 2016-02-11: 80 mg via INTRAMUSCULAR

## 2016-02-11 NOTE — Progress Notes (Signed)
BP 131/78 mmHg  Pulse 66  Temp(Src) 97.6 F (36.4 C) (Oral)  Ht  (1.727 m)  Wt 171 lb (77.565 kg)  BMI 26.01 kg/m2   Subjective:    Patient ID: Natalie Valencia, female    DOB: Oct 19, 1956, 60 y.o.   MRN: [id number]  HPI: Natalie Valencia is a 60 y.o. female presenting on 02/11/2016 for Sinusitis and Wheezing   HPI Shortness of breath and sinus congestion and wheezing Patient has known asthma and severe allergies that she is receiving allergy shots for is currently on maintenance steroid inhaler and has albuterol inhaler on top of it. She comes in today because of increased sinus congestion and wheezing that's been going on for the past 4 days. She denies any fevers or chills. She does have some shortness of breath and a lot of wheezing and a cough which is dry and nonproductive. She has been having postnasal drainage and sinus congestion and nasal drainage. She has been using her albuterol and Nettie pot Advil sinus and cold, Zyrtec and Singulair and it is still getting worse despite this. She is also getting regular allergy injections.  Relevant past medical, surgical, family and social history reviewed and updated as indicated. Interim medical history since our last visit reviewed. Allergies and medications reviewed and updated.  Review of Systems  Constitutional: Negative for fever and chills.  HENT: Positive for congestion, postnasal drip, rhinorrhea, sinus pressure, sneezing and sore throat. Negative for ear discharge and ear pain.   Eyes: Negative for pain, redness and visual disturbance.  Respiratory: Positive for cough and wheezing. Negative for chest tightness and shortness of breath.   Cardiovascular: Negative for chest pain and leg swelling.  Genitourinary: Negative for dysuria and difficulty urinating.  Musculoskeletal: Negative for back pain and gait problem.  Skin: Negative for rash.  Neurological: Negative for light-headedness and headaches.  Psychiatric/Behavioral:  Negative for behavioral problems and agitation.  All other systems reviewed and are negative.   Per HPI unless specifically indicated above     Medication List       This list is accurate as of: 02/11/16  8:36 AM.  Always use your most recent med list.               albuterol 108 (90 Base) MCG/ACT inhaler  Commonly known as:  PROVENTIL HFA;VENTOLIN HFA  2 puffs every 4-6 hours for wheezing     AMBULATORY NON FORMULARY MEDICATION  Allergy Shots Take 1 injection every 2 weeks     aspirin 81 MG tablet  Take 81 mg by mouth daily.     azithromycin 250 MG tablet  Commonly known as:  ZITHROMAX  Take 2 the first day and then one each day after.     budesonide-formoterol 160-4.5 MCG/ACT inhaler  Commonly known as:  SYMBICORT  Inhale 2 puffs into the lungs as needed.     cholecalciferol 1000 units tablet  Commonly known as:  VITAMIN D  Take 1,000 Units by mouth daily.     Ibuprofen 200 MG Caps  Take 1 capsule by mouth as needed.     levothyroxine 50 MCG tablet  Commonly known as:  SYNTHROID, LEVOTHROID  Take 1 tablet (50 mcg total) by mouth daily.     montelukast 10 MG tablet  Commonly known as:  SINGULAIR  Take 10 mg by mouth at bedtime.     pantoprazole 40 MG tablet  Commonly known as:  PROTONIX  Take 1 tablet (40 mg total) by mouth  1 day or 1 dose. Take one 30 minutes before breakfast on an empty stomach     vitamin B-12 1000 MCG tablet  Commonly known as:  CYANOCOBALAMIN  Take 1,000 mcg by mouth daily.     ZYRTEC ALLERGY PO  Take 10 mg by mouth daily.           Objective:    BP 131/78 mmHg  Pulse 66  Temp(Src) 97.6 F (36.4 C) (Oral)  Ht 5\' 8"  (1.727 m)  Wt 171 lb (77.565 kg)  BMI 26.01 kg/m2  Wt Readings from Last 3 Encounters:  02/11/16 171 lb (77.565 kg)  11/11/15 174 lb 9.6 oz (79.198 kg)  08/05/15 174 lb 6.4 oz (79.107 kg)    Physical Exam  Constitutional: She is oriented to person, place, and time. She appears well-developed and  well-nourished. No distress.  HENT:  Right Ear: Tympanic membrane, external ear and ear canal normal.  Left Ear: Tympanic membrane, external ear and ear canal normal.  Nose: Mucosal edema and rhinorrhea present. No epistaxis. Right sinus exhibits no maxillary sinus tenderness and no frontal sinus tenderness. Left sinus exhibits no maxillary sinus tenderness and no frontal sinus tenderness.  Mouth/Throat: Uvula is midline and mucous membranes are normal. Posterior oropharyngeal edema and posterior oropharyngeal erythema present. No oropharyngeal exudate or tonsillar abscesses.  Eyes: Conjunctivae and EOM are normal. Pupils are equal, round, and reactive to light.  Neck: Neck supple. No thyromegaly present.  Cardiovascular: Normal rate, regular rhythm, normal heart sounds and intact distal pulses.   No murmur heard. Pulmonary/Chest: Effort normal. No respiratory distress. She has wheezes. She has no rales.  Musculoskeletal: Normal range of motion. She exhibits no edema or tenderness.  Lymphadenopathy:    She has no cervical adenopathy.  Neurological: She is alert and oriented to person, place, and time. Coordination normal.  Skin: Skin is warm and dry. No rash noted. She is not diaphoretic.  Psychiatric: She has a normal mood and affect. Her behavior is normal.  Nursing note and vitals reviewed.     Assessment & Plan:   Problem List Items Addressed This Visit      Respiratory   Asthma with acute exacerbation - Primary   Relevant Medications   methylPREDNISolone acetate (DEPO-MEDROL) injection 80 mg (Start on 02/11/2016  8:45 AM)   azithromycin (ZITHROMAX) 250 MG tablet      Follow up plan: Return if symptoms worsen or fail to improve.  Counseling provided for all of the vaccine components No orders of the defined types were placed in this encounter.    Arville CareJoshua Seniah Lawrence, MD [facility] 02/11/2016, 8:36 AM

## 2016-03-23 ENCOUNTER — Other Ambulatory Visit: Payer: Self-pay | Admitting: Pediatrics

## 2016-05-18 ENCOUNTER — Ambulatory Visit (INDEPENDENT_AMBULATORY_CARE_PROVIDER_SITE_OTHER): Payer: BLUE CROSS/BLUE SHIELD | Admitting: Pediatrics

## 2016-05-18 ENCOUNTER — Encounter: Payer: Self-pay | Admitting: Pediatrics

## 2016-05-18 VITALS — BP 138/78 | HR 77 | Temp 97.8°F | Ht 68.0 in | Wt 172.6 lb

## 2016-05-18 DIAGNOSIS — K2990 Gastroduodenitis, unspecified, without bleeding: Secondary | ICD-10-CM | POA: Diagnosis not present

## 2016-05-18 DIAGNOSIS — Z124 Encounter for screening for malignant neoplasm of cervix: Secondary | ICD-10-CM

## 2016-05-18 DIAGNOSIS — J309 Allergic rhinitis, unspecified: Secondary | ICD-10-CM | POA: Insufficient documentation

## 2016-05-18 DIAGNOSIS — J45909 Unspecified asthma, uncomplicated: Secondary | ICD-10-CM | POA: Diagnosis not present

## 2016-05-18 DIAGNOSIS — I10 Essential (primary) hypertension: Secondary | ICD-10-CM

## 2016-05-18 DIAGNOSIS — K2970 Gastritis, unspecified, without bleeding: Secondary | ICD-10-CM | POA: Insufficient documentation

## 2016-05-18 DIAGNOSIS — E039 Hypothyroidism, unspecified: Secondary | ICD-10-CM | POA: Diagnosis not present

## 2016-05-18 MED ORDER — LEVOTHYROXINE SODIUM 50 MCG PO TABS: 50.0000 ug | ORAL_TABLET | Freq: Every day | ORAL | Status: DC

## 2016-05-18 MED ORDER — PANTOPRAZOLE SODIUM 40 MG PO TBEC: 40.0000 mg | DELAYED_RELEASE_TABLET | ORAL | Status: DC

## 2016-05-18 MED ORDER — FLUTICASONE PROPIONATE 50 MCG/ACT NA SUSP: 2.0000 | Freq: Every day | NASAL | Status: DC

## 2016-05-18 NOTE — Progress Notes (Signed)
    Subjective:    Patient ID: Natalie Valencia, female    DOB: 1956-07-30, 60 y.o.   MRN: [id number]  CC: Follow-up multiple med problems  HPI: Natalie Valencia is a 60 y.o. female presenting for Follow-up  Asthma: using albuterol a few times a week Husband 3ppd smoker, smokes inside, refuses to smoke outside or not around her Feels like breathing is fairly comfortable Has had congestion for last few weeks Taking meds regularly  GER: well controlled on pantoprazole  HTN: eating fruits and veg No HA, vision changes, SOB or chest pain  Hypothyroidism: not taking levothyroxie, didn't have refills  Dep[facility] screen [facility] 2/9 05/18/2016 02/11/2016 11/11/2015 08/05/2015  Decreased Interest 0 0 0 0  Down, Depressed, Hopeless 0 0 0 0  PHQ - 2 Score 0 0 0 0     Relevant past medical, surgical, family and social history reviewed and updated as indicated.  Interim medical history since our last visit reviewed. Allergies and medications reviewed and updated.  ROS: Per HPI unless specifically indicated above  History  Smoking status  . Never Smoker   Smokeless tobacco  . Never Used       Objective:    BP 138/78 mmHg  Pulse 77  Temp(Src) 97.8 F (36.6 C) (Oral)  Ht 5\' 8"  (1.727 m)  Wt 172 lb 9.6 oz (78.291 kg)  BMI 26.25 kg/m2  Wt Readings from Last 3 Encounters:  05/18/16 172 lb 9.6 oz (78.291 kg)  02/11/16 171 lb (77.565 kg)  11/11/15 174 lb 9.6 oz (79.198 kg)     Gen: NAD, alert, cooperative with exam, NCAT, slightly congested EYES: EOMI, no scleral injection or icterus ENT:  TMs with slight amount fluid b/l, OP without erythema LYMPH: no cervical LAD CV: NRRR, normal S1/S2, no murmur, distal pulses 2+ b/l Resp: slight inspiratory and expiratory wheeze, normal WOB, talks in complete sentences Abd: +BS, soft, NTND. no guarding or organomegaly Ext: No edema, warm Neuro: Alert and oriented, strength equal b/l UE and LE, coordination grossly normal MSK: normal muscle  bulk GU: normal ext exam, no cervix     Assessment & Plan:    Junious DresserConnie was seen today for follow-up med problems.  Diagnoses and all orders for this visit:  Gastritis and gastroduodenitis -     pantoprazole (PROTONIX) 40 MG tablet; Take 1 tablet (40 mg total) by mouth 1 day or 1 dose. Take one 30 minutes before breakfast on an empty stomach  Hypothyroidism, unspecified hypothyroidism type Restart med Recheck TSH next visit -     levothyroxine (SYNTHROID, LEVOTHROID) 50 MCG tablet; Take 1 tablet (50 mcg total) by mouth daily.  Essential hypertension, benign Adequate control today, not on any medicines Continue lifestyle changes  Asthma without acute exacerbation On high dose symbicort, singulair. Taking antihistamines, adding flonase today. Wheezing slightly today, normal WOB Never smoker but lots of passive smoke exposure, minimal control over exposure to it at this point If continues to require albuterol regularly will refer to pulm  Screening for cervical cancer -     Pap IG w/ reflex to HPV when ASC-U  Allergic rhinitis, unspecified allergic rhinitis type -     fluticasone (FLONASE) 50 MCG/ACT nasal spray; Place 2 sprays into both nostrils daily.    Follow up plan: Return in about 6 months (around 11/18/2016) for asthma f/u.  Rex Krasarol Vincent, MD [facility] 05/18/2016, 3:21 PM

## 2016-05-21 LAB — PAP IG W/ RFLX HPV ASCU: PAP SMEAR COMMENT: 0

## 2016-05-31 ENCOUNTER — Ambulatory Visit (INDEPENDENT_AMBULATORY_CARE_PROVIDER_SITE_OTHER): Payer: BLUE CROSS/BLUE SHIELD | Admitting: Pediatrics

## 2016-05-31 ENCOUNTER — Encounter: Payer: Self-pay | Admitting: Pediatrics

## 2016-05-31 VITALS — BP 135/73 | HR 79 | Temp 98.7°F | Ht 68.0 in | Wt 169.2 lb

## 2016-05-31 DIAGNOSIS — J4541 Moderate persistent asthma with (acute) exacerbation: Secondary | ICD-10-CM

## 2016-05-31 MED ORDER — PREDNISONE 20 MG PO TABS: ORAL_TABLET | ORAL | 0 refills | Status: DC

## 2016-05-31 MED ORDER — BREATHERITE COLL SPACER ADULT MISC: 1.0000 | Freq: Four times a day (QID) | 0 refills | Status: DC

## 2016-05-31 NOTE — Progress Notes (Signed)
    Subjective:    Patient ID: Natalie Valencia, female    DOB: Mar 07, 1956, 60 y.o.   MRN: [id number]  CC: Shortness of Breath   HPI: Natalie Valencia is a 60 y.o. female presenting for Shortness of Breath  Started past couple of days Feels like she is having hard time taking deep breaths When she lies down at night feels herself wheezing Taking albuterol a couple of times a day No fevers Coughing, non-productive Otherwise feeling fine Around husband at home who smokes everywhere and constantly   Depression screen [facility] 2/9 05/31/2016 05/18/2016 02/11/2016 11/11/2015 08/05/2015  Decreased Interest 0 0 0 0 0  Down, Depressed, Hopeless 0 0 0 0 0  PHQ - 2 Score 0 0 0 0 0     Relevant past medical, surgical, family and social history reviewed and updated. Interim medical history since our last visit reviewed. Allergies and medications reviewed and updated.  History  Smoking Status  . Never Smoker  Smokeless Tobacco  . Never Used    ROS: Per HPI      Objective:    BP 135/73   Pulse 79   Temp 98.7 F (37.1 C) (Oral)   Ht 5\' 8"  (1.727 m)   Wt 169 lb 3.2 oz (76.7 kg)   SpO2 95%   BMI 25.73 kg/m   Wt Readings from Last 3 Encounters:  05/31/16 169 lb 3.2 oz (76.7 kg)  05/18/16 172 lb 9.6 oz (78.3 kg)  02/11/16 171 lb (77.6 kg)     Gen: NAD, alert, cooperative with exam, NCAT EYES: EOMI, no scleral injection or icterus ENT:  TMs pearly gray b/l, OP without erythema LYMPH: no cervical LAD CV: NRRR, normal S1/S2, no murmur, distal pulses 2+ b/l Resp: moving air fair, wheezes throughout, inspiratory and expiratory, speaks in long phrases. comfortable WOB Ext: No edema, warm Neuro: Alert and oriented, strength equal b/l UE and LE, coordination grossly normal MSK: normal muscle bulk     Assessment & Plan:    Natalie Valencia was seen today for shortness of breath.  Diagnoses and all orders for this visit:  Asthma with acute exacerbation, moderate persistent Use albuteorl  q6h Start steroids Avoid cigarette smoke asmuch as you can Cont symbicort, singulair -     predniSONE (DELTASONE) 20 MG tablet; 2 po at same time daily for 5 days -     Spacer/Aero-Holding Chambers (BREATHERITE COLL SPACER ADULT) MISC; 1 each by Does not apply route 4 (four) times daily.   Follow up plan: Return if symptoms worsen or fail to improve.  Rex Kras, MD [facility] 05/31/2016, 8:27 AM

## 2016-08-07 ENCOUNTER — Encounter: Payer: Self-pay | Admitting: Internal Medicine

## 2016-08-13 ENCOUNTER — Other Ambulatory Visit: Payer: Self-pay | Admitting: Pediatrics

## 2016-08-13 DIAGNOSIS — K2990 Gastroduodenitis, unspecified, without bleeding: Principal | ICD-10-CM

## 2016-08-13 DIAGNOSIS — K2970 Gastritis, unspecified, without bleeding: Secondary | ICD-10-CM

## 2016-10-23 ENCOUNTER — Ambulatory Visit (INDEPENDENT_AMBULATORY_CARE_PROVIDER_SITE_OTHER): Payer: BLUE CROSS/BLUE SHIELD | Admitting: Physician Assistant

## 2016-10-23 ENCOUNTER — Encounter: Payer: Self-pay | Admitting: Physician Assistant

## 2016-10-23 ENCOUNTER — Encounter (INDEPENDENT_AMBULATORY_CARE_PROVIDER_SITE_OTHER): Payer: Self-pay

## 2016-10-23 VITALS — BP 128/78 | HR 74 | Temp 97.2°F | Ht 68.0 in | Wt 165.0 lb

## 2016-10-23 DIAGNOSIS — J209 Acute bronchitis, unspecified: Secondary | ICD-10-CM | POA: Diagnosis not present

## 2016-10-23 MED ORDER — DOXYCYCLINE HYCLATE 100 MG PO TABS: 100.0000 mg | ORAL_TABLET | Freq: Two times a day (BID) | ORAL | 0 refills | Status: DC

## 2016-10-23 MED ORDER — METHYLPREDNISOLONE ACETATE 80 MG/ML IJ SUSP
80.0000 mg | Freq: Once | INTRAMUSCULAR | Status: AC
  Administered 2016-10-23: 80 mg via INTRAMUSCULAR

## 2016-10-23 MED ORDER — HYDROCODONE-HOMATROPINE 5-1.5 MG/5ML PO SYRP: 5.0000 mL | ORAL_SOLUTION | Freq: Four times a day (QID) | ORAL | 0 refills | Status: DC | PRN

## 2016-10-23 NOTE — Progress Notes (Signed)
BP 128/78   Pulse 74   Temp 97.2 F (36.2 C) (Oral)   Ht 5\' 8"  (1.727 m)   Wt 165 lb (74.8 kg)   BMI 25.09 kg/m    Subjective:    Patient ID: Natalie PerfectConnie Christner, female    DOB: Jan 18, 1956, 60 y.o.   MRN: [id number]  HPI: Natalie Valencia is a 60 y.o. female presenting on 10/23/2016 for Cough (x 3 wks, inhaler do not seem to be working, productive mostly white some yellow at times, no known fever, ) and Shortness of Breath (at times,, taking allergy meds, mucinex & allergy shots q 2ks)  Patient with several days of progressing aupprt respiratory and bronchial symptoms. Initially there was more upper respiratory congestion. This progressed to having significant cough that is productive throughout the day and severe at night. There is occasional wheezing after coughing. They will sometimes have slight dyspnea on exertion. It is productive mucus that is yellow in color. Denies any blood.  Relevant past medical, surgical, family and social history reviewed and updated as indicated. Allergies and medications reviewed and updated.  Past Medical History:  Diagnosis Date  . Allergy   . Asthma   . Back pain   . GERD (gastroesophageal reflux disease)   . Hypertension   . Osteopenia   . Thyroid disease     Past Surgical History:  Procedure Laterality Date  . ABDOMINAL HYSTERECTOMY  2001  . NASAL SINUS SURGERY      Review of Systems  Constitutional: Positive for chills and fatigue. Negative for activity change and appetite change.  HENT: Positive for congestion, postnasal drip and sore throat.   Eyes: Negative.   Respiratory: Positive for cough and wheezing.   Cardiovascular: Negative.  Negative for chest pain, palpitations and leg swelling.  Gastrointestinal: Negative.   Genitourinary: Negative.   Musculoskeletal: Negative.   Skin: Negative.   Neurological: Positive for headaches.    Allergies as of 10/23/2016   No Known Allergies     Medication List       Accurate as of  10/23/16 12:19 PM. Always use your most recent med list.          albuterol 108 (90 Base) MCG/ACT inhaler Commonly known as:  PROVENTIL HFA;VENTOLIN HFA 2 puffs every 4-6 hours for wheezing   AMBULATORY NON FORMULARY MEDICATION Allergy Shots Take 1 injection every 2 weeks   BREATHERITE COLL SPACER ADULT Misc 1 each by Does not apply route 4 (four) times daily.   budesonide-formoterol 160-4.5 MCG/ACT inhaler Commonly known as:  SYMBICORT Inhale 2 puffs into the lungs as needed.   cholecalciferol 1000 units tablet Commonly known as:  VITAMIN D Take 1,000 Units by mouth daily.   doxycycline 100 MG tablet Commonly known as:  VIBRA-TABS Take 1 tablet (100 mg total) by mouth 2 (two) times daily. 1 po bid   fluticasone 50 MCG/ACT nasal spray Commonly known as:  FLONASE Place 2 sprays into both nostrils daily.   HYDROcodone-homatropine 5-1.5 MG/5ML syrup Commonly known as:  HYCODAN Take 5-10 mLs by mouth every 6 (six) hours as needed.   Ibuprofen 200 MG Caps Take 1 capsule by mouth as needed.   levothyroxine 50 MCG tablet Commonly known as:  SYNTHROID, LEVOTHROID Take 1 tablet (50 mcg total) by mouth daily.   montelukast 10 MG tablet Commonly known as:  SINGULAIR Take 10 mg by mouth at bedtime.   pantoprazole 40 MG tablet Commonly known as:  PROTONIX TAKE ONE TABLET BY MOUTH 30 minutes  BEFORE breakfast ON an EMPTY stomach   vitamin B-12 1000 MCG tablet Commonly known as:  CYANOCOBALAMIN Take 1,000 mcg by mouth daily.   ZYRTEC ALLERGY PO Take 10 mg by mouth daily.          Objective:    BP 128/78   Pulse 74   Temp 97.2 F (36.2 C) (Oral)   Ht 5\' 8"  (1.727 m)   Wt 165 lb (74.8 kg)   BMI 25.09 kg/m   No Known Allergies  Physical Exam  Constitutional: She is oriented to person, place, and time. She appears well-developed and well-nourished.  HENT:  Head: Normocephalic and atraumatic.  Right Ear: There is drainage and tenderness.  Left Ear: There is  drainage and tenderness.  Nose: Mucosal edema and rhinorrhea present. Right sinus exhibits maxillary sinus tenderness and frontal sinus tenderness. Left sinus exhibits maxillary sinus tenderness and frontal sinus tenderness.  Mouth/Throat: Oropharyngeal exudate and posterior oropharyngeal erythema present.  Eyes: Conjunctivae and EOM are normal. Pupils are equal, round, and reactive to light.  Neck: Normal range of motion. Neck supple.  Cardiovascular: Normal rate, regular rhythm, normal heart sounds and intact distal pulses.   Pulmonary/Chest: Effort normal. She has wheezes in the right upper field and the left upper field.  Abdominal: Soft. Bowel sounds are normal.  Neurological: She is alert and oriented to person, place, and time. She has normal reflexes.  Skin: Skin is warm and dry. No rash noted.  Psychiatric: She has a normal mood and affect. Her behavior is normal. Judgment and thought content normal.    Results for orders placed or performed in visit on 05/18/16  Pap IG w/ reflex to HPV when ASC-U  Result Value Ref Range   DIAGNOSIS: Comment    Specimen adequacy: Comment    CLINICIAN PROVIDED ICD10: Comment    Performed by: Comment    PAP SMEAR COMMENT .    Note: Comment    Test Methodology Comment    PAP REFLEX: Comment       Assessment & Plan:   1. Acute bronchitis, unspecified organism - methylPREDNISolone acetate (DEPO-MEDROL) injection 80 mg; Inject 1 mL (80 mg total) into the muscle once. - doxycycline (VIBRA-TABS) 100 MG tablet; Take 1 tablet (100 mg total) by mouth 2 (two) times daily. 1 po bid  Dispense: 20 tablet; Refill: 0 - HYDROcodone-homatropine (HYCODAN) 5-1.5 MG/5ML syrup; Take 5-10 mLs by mouth every 6 (six) hours as needed.  Dispense: 240 mL; Refill: 0   Continue all other maintenance medications as listed above.  Follow up plan: Return if symptoms worsen or fail to improve.  Educational handout given for bronchitis   Remus LofflerAngel S. Dorothee Napierkowski PA-C [facility] [address]  [place], [place] [address] [phone]   10/23/2016, 12:19 PM

## 2016-10-23 NOTE — Patient Instructions (Signed)

## 2016-11-23 ENCOUNTER — Encounter: Payer: Self-pay | Admitting: Pediatrics

## 2016-11-23 ENCOUNTER — Ambulatory Visit (INDEPENDENT_AMBULATORY_CARE_PROVIDER_SITE_OTHER): Payer: BLUE CROSS/BLUE SHIELD | Admitting: Pediatrics

## 2016-11-23 VITALS — BP 131/73 | HR 77 | Temp 98.3°F | Ht 68.0 in | Wt 166.2 lb

## 2016-11-23 DIAGNOSIS — Z0000 Encounter for general adult medical examination without abnormal findings: Secondary | ICD-10-CM

## 2016-11-23 DIAGNOSIS — E039 Hypothyroidism, unspecified: Secondary | ICD-10-CM

## 2016-11-23 DIAGNOSIS — Z23 Encounter for immunization: Secondary | ICD-10-CM

## 2016-11-23 DIAGNOSIS — J45909 Unspecified asthma, uncomplicated: Secondary | ICD-10-CM

## 2016-11-23 MED ORDER — LEVOTHYROXINE SODIUM 50 MCG PO TABS: 50.0000 ug | ORAL_TABLET | Freq: Every day | ORAL | 7 refills | Status: DC

## 2016-11-23 MED ORDER — MONTELUKAST SODIUM 10 MG PO TABS: 10.0000 mg | ORAL_TABLET | Freq: Every day | ORAL | 11 refills | Status: DC

## 2016-11-23 NOTE — Progress Notes (Signed)
  Subjective:   Patient ID: Natalie Valencia, female    DOB: 1956/06/05, 61 y.o.   MRN: [id number] CC:  Annual and Cough   HPI: Zerenity Bowron is a 61 y.o. female presenting for follow up  Uses albuterol twice at night Has been followed with allergy for asthma Getting allergy shots for dust mite allergies Allergist is looking into getting other shot with insurance co, possibly xolair from her description Continues to need albuterol daily Taking flonase, antihistamine, singulair regularly Trying to stay away from cig smoke  Has appt for colonoscopy upcoming  Takes thyroid replacement med daily  Relevant past medical, surgical, family and social history reviewed. Allergies and medications reviewed and updated. History  Smoking Status  . Never Smoker  Smokeless Tobacco  . Never Used   ROS: All systems neg other than what is in HPI  Objective:    BP 131/73   Pulse 77   Temp 98.3 F (36.8 C) (Oral)   Ht _0  (1.727 m)   Wt 166 lb 3.2 oz (75.4 kg)   BMI 25.27 kg/m   Wt Readings from Last 3 Encounters:  11/23/16 166 lb 3.2 oz (75.4 kg)  10/23/16 165 lb (74.8 kg)  05/31/16 169 lb 3.2 oz (76.7 kg)    Gen: NAD, alert, cooperative with exam, NCAT EYES: EOMI, no conjunctival injection, or no icterus ENT:  TMs dull gray b/l, OP without erythema LYMPH: no cervical LAD CV: NRRR, normal S1/S2, no murmur, distal pulses 2+ b/l Resp: end expiration wheeze b/l, normal WOB Abd: +BS, soft, NTND. no guarding or organomegaly Ext: No edema, warm Neuro: Alert and oriented  Assessment & Plan:  Marcia was seen today for follow-up and cough.  Diagnoses and all orders for this visit:  Encounter for preventive health examination Pap smear: not indicated, s/p hysterectomy Colonoscopy: has one scheduled upcoming Given asthma, will do pneumovax today -     Lipid panel -     CMP14+EGFR -     Hepatitis C antibody -     Pneumococcal polysaccharide vaccine 23-valent greater than or equal to  2yo subcutaneous/IM  Hypothyroidism, unspecified type Stable, recheck tsh, cont med -     levothyroxine (SYNTHROID, LEVOTHROID) 50 MCG tablet; Take 1 tablet (50 mcg total) by mouth daily. -     TSH  Asthma without acute exacerbation Continues to have symtpoms regularly, using albuterol nightly, on symbicort, getting allergy shots, on singulair Followed by allergy, has f/u with them  Encounter for immunization -     Flu Vaccine QUAD 36+ mos IM   Follow up plan: Return in about 1 year (around 11/23/2017). Assunta Found, MD [place]

## 2016-11-24 LAB — CMP14+EGFR
ALT: 14 IU/L (ref 0–32)
AST: 13 IU/L (ref 0–40)
Albumin/Globulin Ratio: 1.4 (ref 1.2–2.2)
Albumin: 4.3 g/dL (ref 3.6–4.8)
Alkaline Phosphatase: 96 IU/L (ref 39–117)
BUN/Creatinine Ratio: 26 (ref 12–28)
BUN: 22 mg/dL (ref 8–27)
Bilirubin Total: <0.2 mg/dL (ref 0.0–1.2)
CALCIUM: 9.7 mg/dL (ref 8.7–10.3)
CO2: 24 mmol/L (ref 18–29)
CREATININE: 0.84 mg/dL (ref 0.57–1.00)
Chloride: 103 mmol/L (ref 96–106)
GFR calc Af Amer: 87 mL/min/{1.73_m2} (ref >59)
GFR, EST NON AFRICAN AMERICAN: 75 mL/min/{1.73_m2} (ref >59)
GLOBULIN, TOTAL: 3 g/dL (ref 1.5–4.5)
Glucose: 102 mg/dL — ABNORMAL HIGH (ref 65–99)
Potassium: 4 mmol/L (ref 3.5–5.2)
SODIUM: 142 mmol/L (ref 134–144)
Total Protein: 7.3 g/dL (ref 6.0–8.5)

## 2016-11-24 LAB — LIPID PANEL
CHOLESTEROL TOTAL: 237 mg/dL — AB (ref 100–199)
Chol/HDL Ratio: 2.5 ratio units (ref 0.0–4.4)
HDL: 95 mg/dL (ref >39)
LDL CALC: 127 mg/dL — AB (ref 0–99)
Triglycerides: 76 mg/dL (ref 0–149)
VLDL Cholesterol Cal: 15 mg/dL (ref 5–40)

## 2016-11-24 LAB — HEPATITIS C ANTIBODY: Hep C Virus Ab: <0.1 s/co ratio (ref 0.0–0.9)

## 2016-11-24 LAB — TSH: TSH: 2.02 u[IU]/mL (ref 0.450–4.500)

## 2017-04-13 ENCOUNTER — Ambulatory Visit (INDEPENDENT_AMBULATORY_CARE_PROVIDER_SITE_OTHER): Payer: BLUE CROSS/BLUE SHIELD | Admitting: Family Medicine

## 2017-04-13 ENCOUNTER — Other Ambulatory Visit: Payer: Self-pay | Admitting: Family Medicine

## 2017-04-13 VITALS — BP 132/69 | HR 77 | Temp 97.1°F | Ht 68.0 in | Wt 168.0 lb

## 2017-04-13 DIAGNOSIS — J0111 Acute recurrent frontal sinusitis: Secondary | ICD-10-CM

## 2017-04-13 MED ORDER — PSEUDOEPHEDRINE-GUAIFENESIN ER 120-1200 MG PO TB12: 1.0000 | ORAL_TABLET | Freq: Two times a day (BID) | ORAL | 0 refills | Status: DC

## 2017-04-13 MED ORDER — AMOXICILLIN-POT CLAVULANATE 875-125 MG PO TABS: 1.0000 | ORAL_TABLET | Freq: Two times a day (BID) | ORAL | 0 refills | Status: DC

## 2017-04-13 NOTE — Addendum Note (Signed)
Addended by: Mechele ClaudeSTACKS, Jabril Pursell on: 04/13/2017 10:22 AM   Modules accepted: Orders

## 2017-04-13 NOTE — Progress Notes (Addendum)
Chief Complaint  Patient presents with  . Sinusitis    drainage, facial pressure, yellow - started 3-4 days ago    HPI  Patient presents today for Symptoms include congestion, facial pain, nasal congestion, non productive cough, post nasal drip and sinus pressure. There is no fever, chills, or sweats. Onset of symptoms was a few days ago, gradually worsening since that time.    PMH: Smoking status noted ROS: Per HPI  Objective: BP 132/69 (BP Location: Left Arm)   Pulse 77   Temp 97.1 F (36.2 C) (Oral)   Ht 5\' 8"  (1.727 m)   Wt 168 lb (76.2 kg)   BMI 25.54 kg/m  Gen: NAD, alert, cooperative with exam HEENT: NCAT, EOMI, PERRL. Nasal passages swollen and erythematous with exudate noted. Pharynx has posterior drainage tracts as well. 2+ anterior cervical adenopathy. Lungs with faint inspiratory wheezing. CV: RRR, good S1/S2, no murmur Resp: CTABL, no wheezes, non-labored Abd: SNTND, BS present, no guarding or organomegaly Ext: No edema, warm Neuro: Alert and oriented, No gross deficits  Assessment and plan:  1. Acute recurrent frontal sinusitis     Meds ordered this encounter  Medications  . DISCONTD: amoxicillin-clavulanate (AUGMENTIN) 875-125 MG tablet    Sig: Take 1 tablet by mouth 2 (two) times daily. Take all of this medication    Dispense:  20 tablet    Refill:  0  . DISCONTD: Pseudoephedrine-Guaifenesin 980 774 2331 MG TB12    Sig: Take 1 tablet by mouth 2 (two) times daily. For congestion    Dispense:  20 each    Refill:  0  . amoxicillin-clavulanate (AUGMENTIN) 875-125 MG tablet    Sig: Take 1 tablet by mouth 2 (two) times daily. Take all of this medication    Dispense:  20 tablet    Refill:  0  . Pseudoephedrine-Guaifenesin 980 774 2331 MG TB12    Sig: Take 1 tablet by mouth 2 (two) times daily. For congestion    Dispense:  20 each    Refill:  0    No orders of the defined types were placed in this encounter.   Follow up as needed.  Mechele ClaudeWarren Angelea Penny,  MD

## 2017-04-13 NOTE — Addendum Note (Signed)
Addended by: Mechele ClaudeSTACKS, Luva Metzger on: 04/13/2017 10:46 AM   Modules accepted: Orders

## 2017-05-07 ENCOUNTER — Other Ambulatory Visit: Payer: Self-pay | Admitting: Pediatrics

## 2017-05-07 DIAGNOSIS — K2970 Gastritis, unspecified, without bleeding: Secondary | ICD-10-CM

## 2017-05-07 DIAGNOSIS — K2990 Gastroduodenitis, unspecified, without bleeding: Principal | ICD-10-CM

## 2017-07-02 DIAGNOSIS — J3089 Other allergic rhinitis: Secondary | ICD-10-CM | POA: Diagnosis not present

## 2017-07-18 ENCOUNTER — Other Ambulatory Visit: Payer: Self-pay | Admitting: Pediatrics

## 2017-07-18 DIAGNOSIS — J3089 Other allergic rhinitis: Secondary | ICD-10-CM | POA: Diagnosis not present

## 2017-07-18 DIAGNOSIS — E039 Hypothyroidism, unspecified: Secondary | ICD-10-CM

## 2017-07-31 DIAGNOSIS — J3089 Other allergic rhinitis: Secondary | ICD-10-CM | POA: Diagnosis not present

## 2017-08-13 DIAGNOSIS — J3089 Other allergic rhinitis: Secondary | ICD-10-CM | POA: Diagnosis not present

## 2017-08-27 DIAGNOSIS — J3089 Other allergic rhinitis: Secondary | ICD-10-CM | POA: Diagnosis not present

## 2017-09-05 DIAGNOSIS — Z1231 Encounter for screening mammogram for malignant neoplasm of breast: Secondary | ICD-10-CM | POA: Diagnosis not present

## 2017-09-11 DIAGNOSIS — J3089 Other allergic rhinitis: Secondary | ICD-10-CM | POA: Diagnosis not present

## 2017-09-24 DIAGNOSIS — J3089 Other allergic rhinitis: Secondary | ICD-10-CM | POA: Diagnosis not present

## 2017-10-14 ENCOUNTER — Other Ambulatory Visit: Payer: Self-pay | Admitting: Pediatrics

## 2017-10-14 DIAGNOSIS — K2970 Gastritis, unspecified, without bleeding: Secondary | ICD-10-CM

## 2017-10-14 DIAGNOSIS — K2990 Gastroduodenitis, unspecified, without bleeding: Principal | ICD-10-CM

## 2017-10-14 DIAGNOSIS — J3089 Other allergic rhinitis: Secondary | ICD-10-CM | POA: Diagnosis not present

## 2017-10-23 DIAGNOSIS — J3089 Other allergic rhinitis: Secondary | ICD-10-CM | POA: Diagnosis not present

## 2017-10-30 DIAGNOSIS — J3089 Other allergic rhinitis: Secondary | ICD-10-CM | POA: Diagnosis not present

## 2017-11-13 DIAGNOSIS — J3089 Other allergic rhinitis: Secondary | ICD-10-CM | POA: Diagnosis not present

## 2017-11-27 DIAGNOSIS — J3089 Other allergic rhinitis: Secondary | ICD-10-CM | POA: Diagnosis not present

## 2017-12-20 ENCOUNTER — Ambulatory Visit: Payer: BLUE CROSS/BLUE SHIELD | Admitting: Physician Assistant

## 2017-12-20 ENCOUNTER — Encounter: Payer: Self-pay | Admitting: Internal Medicine

## 2017-12-20 ENCOUNTER — Encounter: Payer: Self-pay | Admitting: Physician Assistant

## 2017-12-20 VITALS — BP 124/66 | HR 73 | Temp 97.9°F | Ht 68.0 in | Wt 167.8 lb

## 2017-12-20 DIAGNOSIS — E039 Hypothyroidism, unspecified: Secondary | ICD-10-CM

## 2017-12-20 DIAGNOSIS — J0101 Acute recurrent maxillary sinusitis: Secondary | ICD-10-CM | POA: Diagnosis not present

## 2017-12-20 MED ORDER — LEVOTHYROXINE SODIUM 50 MCG PO TABS: 50.0000 ug | ORAL_TABLET | Freq: Every day | ORAL | 5 refills | Status: DC

## 2017-12-20 MED ORDER — AMOXICILLIN 500 MG PO CAPS: 500.0000 mg | ORAL_CAPSULE | Freq: Three times a day (TID) | ORAL | 0 refills | Status: DC

## 2017-12-20 MED ORDER — CETIRIZINE HCL 10 MG PO TABS: 10.0000 mg | ORAL_TABLET | Freq: Every day | ORAL | 11 refills | Status: DC

## 2017-12-20 NOTE — Progress Notes (Signed)
BP 124/66   Pulse 73   Temp 97.9 F (36.6 C) (Oral)   Ht 5\' 8"  (1.727 m)   Wt 167 lb 12.8 oz (76.1 kg)   BMI 25.51 kg/m    Subjective:    Patient ID: Natalie Valencia, female    DOB: 1956-02-15, 62 y.o.   MRN: [id number]  HPI: Natalie Valencia is a 62 y.o. female presenting on 12/20/2017 for Nasal Congestion and Sore Throat This patient has had many days of sinus headache and postnasal drainage. There is copious drainage at times. Denies any fever at this time. There has been a history of sinus infections in the past.  No history of sinus surgery. There is cough at night. It has become more prevalent in recent days.   Past Medical History:  Diagnosis Date  . Allergy   . Asthma   . Back pain   . GERD (gastroesophageal reflux disease)   . Hypertension   . Osteopenia   . Thyroid disease    Relevant past medical, surgical, family and social history reviewed and updated as indicated. Interim medical history since our last visit reviewed. Allergies and medications reviewed and updated. DATA REVIEWED: CHART IN EPIC  Family History reviewed for pertinent findings.  Review of Systems  Constitutional: Positive for chills and fatigue. Negative for activity change, appetite change and fever.  HENT: Positive for congestion, postnasal drip, sinus pressure, sinus pain and sore throat.   Eyes: Negative.   Respiratory: Positive for cough. Negative for wheezing.   Cardiovascular: Negative.  Negative for chest pain, palpitations and leg swelling.  Gastrointestinal: Negative.   Genitourinary: Negative.   Musculoskeletal: Negative.   Skin: Negative.   Neurological: Positive for headaches.    Allergies as of 12/20/2017   No Known Allergies     Medication List        Accurate as of 12/20/17 11:04 AM. Always use your most recent med list.          albuterol 108 (90 Base) MCG/ACT inhaler Commonly known as:  PROVENTIL HFA;VENTOLIN HFA 2 puffs every 4-6 hours for wheezing   AMBULATORY  NON FORMULARY MEDICATION Allergy Shots Take 1 injection every 2 weeks   amoxicillin 500 MG capsule Commonly known as:  AMOXIL Take 1 capsule (500 mg total) by mouth 3 (three) times daily.   BREATHERITE COLL SPACER ADULT Misc 1 each by Does not apply route 4 (four) times daily.   budesonide-formoterol 160-4.5 MCG/ACT inhaler Commonly known as:  SYMBICORT Inhale 2 puffs into the lungs as needed.   cetirizine 10 MG tablet Commonly known as:  ZYRTEC ALLERGY Take 1 tablet (10 mg total) by mouth daily.   cholecalciferol 1000 units tablet Commonly known as:  VITAMIN D Take 1,000 Units by mouth daily.   fluticasone 50 MCG/ACT nasal spray Commonly known as:  FLONASE Place 2 sprays into both nostrils daily.   Ibuprofen 200 MG Caps Take 1 capsule by mouth as needed.   levothyroxine 50 MCG tablet Commonly known as:  SYNTHROID, LEVOTHROID Take 1 tablet (50 mcg total) by mouth daily.   montelukast 10 MG tablet Commonly known as:  SINGULAIR Take 1 tablet (10 mg total) by mouth at bedtime.   pantoprazole 40 MG tablet Commonly known as:  PROTONIX TAKE ONE TABLET BY MOUTH 30 MINUTES BEFORE BREAKFAST ON AN EMPTY STOMACH   Pseudoephedrine-Guaifenesin [phone] MG Tb12 Take 1 tablet by mouth 2 (two) times daily. For congestion   vitamin B-12 1000 MCG tablet Commonly known as:  CYANOCOBALAMIN Take 1,000 mcg by mouth daily.          Objective:    BP 124/66   Pulse 73   Temp 97.9 F (36.6 C) (Oral)   Ht 5\' 8"  (1.727 m)   Wt 167 lb 12.8 oz (76.1 kg)   BMI 25.51 kg/m   No Known Allergies  Wt Readings from Last 3 Encounters:  12/20/17 167 lb 12.8 oz (76.1 kg)  04/13/17 168 lb (76.2 kg)  11/23/16 166 lb 3.2 oz (75.4 kg)    Physical Exam  Constitutional: She is oriented to person, place, and time. She appears well-developed and well-nourished.  HENT:  Head: Normocephalic and atraumatic.  Right Ear: Tympanic membrane and external ear normal. No middle ear effusion.  Left  Ear: Tympanic membrane and external ear normal.  No middle ear effusion.  Nose: Mucosal edema and rhinorrhea present. Right sinus exhibits no maxillary sinus tenderness. Left sinus exhibits no maxillary sinus tenderness.  Mouth/Throat: Uvula is midline. Posterior oropharyngeal erythema present.  Eyes: Conjunctivae and EOM are normal. Pupils are equal, round, and reactive to light. Right eye exhibits no discharge. Left eye exhibits no discharge.  Neck: Normal range of motion.  Cardiovascular: Normal rate, regular rhythm and normal heart sounds.  Pulmonary/Chest: Effort normal and breath sounds normal. No respiratory distress. She has no wheezes.  Abdominal: Soft.  Lymphadenopathy:    She has no cervical adenopathy.  Neurological: She is alert and oriented to person, place, and time.  Skin: Skin is warm and dry.  Psychiatric: She has a normal mood and affect.        Assessment & Plan:   1. Acute recurrent maxillary sinusitis - amoxicillin (AMOXIL) 500 MG capsule; Take 1 capsule (500 mg total) by mouth 3 (three) times daily.  Dispense: 30 capsule; Refill: 0  2. Hypothyroidism, unspecified type - levothyroxine (SYNTHROID, LEVOTHROID) 50 MCG tablet; Take 1 tablet (50 mcg total) by mouth daily.  Dispense: 30 tablet; Refill: 5   Continue all other maintenance medications as listed above.  Follow up plan: Return if symptoms worsen or fail to improve.  Educational handout given for survey  Remus LofflerAngel S. Kashauna Celmer PA-C [facility] [address]  [place], [place] [address] [phone]   12/20/2017, 11:04 AM

## 2017-12-20 NOTE — Patient Instructions (Signed)
In a few days you may receive a survey in the mail or online from [employer] regarding your visit with us today. Please take a moment to fill this out. Your feedback is very important to our whole office. It can help us better understand your needs as well as improve your experience and satisfaction. Thank you for taking your time to complete it. We care about you.  Karas Pickerill, PA-C  

## 2017-12-23 ENCOUNTER — Other Ambulatory Visit: Payer: Self-pay | Admitting: *Deleted

## 2017-12-23 DIAGNOSIS — E039 Hypothyroidism, unspecified: Secondary | ICD-10-CM

## 2017-12-23 MED ORDER — LEVOTHYROXINE SODIUM 50 MCG PO TABS: 50.0000 ug | ORAL_TABLET | Freq: Every day | ORAL | 1 refills | Status: DC

## 2017-12-25 DIAGNOSIS — J3089 Other allergic rhinitis: Secondary | ICD-10-CM | POA: Diagnosis not present

## 2018-01-08 DIAGNOSIS — J3089 Other allergic rhinitis: Secondary | ICD-10-CM | POA: Diagnosis not present

## 2018-01-10 ENCOUNTER — Other Ambulatory Visit: Payer: Self-pay | Admitting: *Deleted

## 2018-01-10 DIAGNOSIS — K2990 Gastroduodenitis, unspecified, without bleeding: Principal | ICD-10-CM

## 2018-01-10 DIAGNOSIS — K2970 Gastritis, unspecified, without bleeding: Secondary | ICD-10-CM

## 2018-01-10 MED ORDER — PANTOPRAZOLE SODIUM 40 MG PO TBEC: DELAYED_RELEASE_TABLET | ORAL | 0 refills | Status: DC

## 2018-01-14 DIAGNOSIS — J3089 Other allergic rhinitis: Secondary | ICD-10-CM | POA: Diagnosis not present

## 2018-01-14 DIAGNOSIS — J0100 Acute maxillary sinusitis, unspecified: Secondary | ICD-10-CM | POA: Diagnosis not present

## 2018-01-14 DIAGNOSIS — J4541 Moderate persistent asthma with (acute) exacerbation: Secondary | ICD-10-CM | POA: Diagnosis not present

## 2018-01-21 DIAGNOSIS — J3089 Other allergic rhinitis: Secondary | ICD-10-CM | POA: Diagnosis not present

## 2018-02-06 DIAGNOSIS — J3089 Other allergic rhinitis: Secondary | ICD-10-CM | POA: Diagnosis not present

## 2018-02-19 DIAGNOSIS — J3089 Other allergic rhinitis: Secondary | ICD-10-CM | POA: Diagnosis not present

## 2018-03-07 ENCOUNTER — Other Ambulatory Visit: Payer: Self-pay

## 2018-03-07 ENCOUNTER — Other Ambulatory Visit: Payer: Self-pay | Admitting: Pediatrics

## 2018-03-07 ENCOUNTER — Ambulatory Visit (AMBULATORY_SURGERY_CENTER): Payer: Self-pay | Admitting: *Deleted

## 2018-03-07 VITALS — Ht 67.5 in | Wt 166.0 lb

## 2018-03-07 DIAGNOSIS — K2990 Gastroduodenitis, unspecified, without bleeding: Principal | ICD-10-CM

## 2018-03-07 DIAGNOSIS — K2970 Gastritis, unspecified, without bleeding: Secondary | ICD-10-CM

## 2018-03-07 DIAGNOSIS — Z1211 Encounter for screening for malignant neoplasm of colon: Secondary | ICD-10-CM

## 2018-03-07 MED ORDER — NA SULFATE-K SULFATE-MG SULF 17.5-3.13-1.6 GM/177ML PO SOLN: 1.0000 [IU] | Freq: Once | ORAL | 0 refills | Status: AC

## 2018-03-07 NOTE — Progress Notes (Signed)
No egg or soy allergy known to patient  No issues with past sedation with any surgeries  or procedures, no intubation problems  No diet pills per patient No home 02 use per patient  No blood thinners per patient  Pt denies issues with constipation on and off alternates with diarrhea  Sometimes uses suppositories or miralax/Dulcolax No A fib or A flutter  EMMI video sent to pt's e mail

## 2018-03-12 MED ORDER — PANTOPRAZOLE SODIUM 40 MG PO TBEC: DELAYED_RELEASE_TABLET | ORAL | 0 refills | Status: DC

## 2018-03-17 ENCOUNTER — Encounter: Payer: Self-pay | Admitting: Internal Medicine

## 2018-03-19 DIAGNOSIS — J3089 Other allergic rhinitis: Secondary | ICD-10-CM | POA: Diagnosis not present

## 2018-03-21 ENCOUNTER — Encounter: Payer: Self-pay | Admitting: Internal Medicine

## 2018-03-21 ENCOUNTER — Ambulatory Visit (AMBULATORY_SURGERY_CENTER): Payer: BLUE CROSS/BLUE SHIELD | Admitting: Internal Medicine

## 2018-03-21 ENCOUNTER — Other Ambulatory Visit: Payer: Self-pay

## 2018-03-21 VITALS — BP 136/64 | HR 60 | Temp 98.2°F | Resp 11 | Ht 67.5 in | Wt 166.0 lb

## 2018-03-21 DIAGNOSIS — Z1211 Encounter for screening for malignant neoplasm of colon: Secondary | ICD-10-CM

## 2018-03-21 MED ORDER — SODIUM CHLORIDE 0.9 % IV SOLN: 500.0000 mL | Freq: Once | INTRAVENOUS | Status: DC

## 2018-03-21 NOTE — Patient Instructions (Signed)
Impression/recommendations:  Normal colonoscopy Repeat colonoscopy in 10 years.  YOU HAD AN ENDOSCOPIC PROCEDURE TODAY AT THE [facility]:   Refer to the procedure report that was given to you for any specific questions about what was found during the examination.  If the procedure report does not answer your questions, please call your gastroenterologist to clarify.  If you requested that your care partner not be given the details of your procedure findings, then the procedure report has been included in a sealed envelope for you to review at your convenience later.  YOU SHOULD EXPECT: Some feelings of bloating in the abdomen. Passage of more gas than usual.  Walking can help get rid of the air that was put into your GI tract during the procedure and reduce the bloating. If you had a lower endoscopy (such as a colonoscopy or flexible sigmoidoscopy) you may notice spotting of blood in your stool or on the toilet paper. If you underwent a bowel prep for your procedure, you may not have a normal bowel movement for a few days.  Please Note:  You might notice some irritation and congestion in your nose or some drainage.  This is from the oxygen used during your procedure.  There is no need for concern and it should clear up in a day or so.  SYMPTOMS TO REPORT IMMEDIATELY:   Following lower endoscopy (colonoscopy or flexible sigmoidoscopy):  Excessive amounts of blood in the stool  Significant tenderness or worsening of abdominal pains  Swelling of the abdomen that is new, acute  Fever of 100F or higher  For urgent or emergent issues, a gastroenterologist can be reached at any hour by calling [phone].   DIET:  We do recommend a small meal at first, but then you may proceed to your regular diet.  Drink plenty of fluids but you should avoid alcoholic beverages for 24 hours.  ACTIVITY:  You should plan to take it easy for the rest of today and you should NOT DRIVE or use heavy  machinery until tomorrow (because of the sedation medicines used during the test).    FOLLOW UP: Our staff will call the number listed on your records the next business day following your procedure to check on you and address any questions or concerns that you may have regarding the information given to you following your procedure. If we do not reach you, we will leave a message.  However, if you are feeling well and you are not experiencing any problems, there is no need to return our call.  We will assume that you have returned to your regular daily activities without incident.  If any biopsies were taken you will be contacted by phone or by letter within the next 1-3 weeks.  Please call us at [phone] if you have not heard about the biopsies in 3 weeks.    SIGNATURES/CONFIDENTIALITY: You and/or your care partner have signed paperwork which will be entered into your electronic medical record.  These signatures attest to the fact that that the information above on your After Visit Summary has been reviewed and is understood.  Full responsibility of the confidentiality of this discharge information lies with you and/or your care-partner. 

## 2018-03-21 NOTE — Op Note (Signed)
[facility] Patient Name: Natalie Valencia Procedure Date: 03/21/2018 9:01 AM MRN: [id number] Endoscopist: Beverley Fiedler , MD Age: 62 Referring MD:  Date of Birth: 1955-12-13 Gender: Female Account #: [id number] Procedure:                Colonoscopy Indications:              Screening for colorectal malignant neoplasm, Last                            colonoscopy: 2007 Medicines:                Monitored Anesthesia Care Procedure:                Pre-Anesthesia Assessment:                           - Prior to the procedure, a History and Physical                            was performed, and patient medications and                            allergies were reviewed. The patient's tolerance of                            previous anesthesia was also reviewed. The risks                            and benefits of the procedure and the sedation                            options and risks were discussed with the patient.                            All questions were answered, and informed consent                            was obtained. Prior Anticoagulants: The patient has                            taken no previous anticoagulant or antiplatelet                            agents. ASA Grade Assessment: II - A patient with                            mild systemic disease. After reviewing the risks                            and benefits, the patient was deemed in                            satisfactory condition to undergo the procedure.  After obtaining informed consent, the colonoscope                            was passed under direct vision. Throughout the                            procedure, the patient's blood pressure, pulse, and                            oxygen saturations were monitored continuously. The                            Model CF-HQ190L [id number]) scope was introduced                            through the anus and advanced to the cecum,                             identified by appendiceal orifice and ileocecal                            valve. The colonoscopy was performed without                            difficulty. The patient tolerated the procedure                            well. The quality of the bowel preparation was                            good. The ileocecal valve, appendiceal orifice, and                            rectum were photographed. Scope In: 9:27:40 AM Scope Out: 9:37:39 AM Scope Withdrawal Time: 0 hours 7 minutes 44 seconds  Total Procedure Duration: 0 hours 9 minutes 59 seconds  Findings:                 The entire examined colon appeared normal on direct                            and retroflexion views. Complications:            No immediate complications. Estimated Blood Loss:     Estimated blood loss: none. Impression:               - The entire examined colon is normal on direct and                            retroflexion views.                           - No specimens collected. Recommendation:           - Patient has a contact number available for  emergencies. The signs and symptoms of potential                            delayed complications were discussed with the                            patient. Return to normal activities tomorrow.                            Written discharge instructions were provided to the                            patient.                           - Resume previous diet.                           - Continue present medications.                           - Repeat colonoscopy in 10 years for screening                            purposes. Beverley Fiedler, MD 03/21/2018 9:39:33 AM This report has been signed electronically.

## 2018-03-21 NOTE — Progress Notes (Signed)
A/ox3 pleased with MAC, report to [facility]

## 2018-03-25 ENCOUNTER — Telehealth: Payer: Self-pay | Admitting: *Deleted

## 2018-03-25 NOTE — Telephone Encounter (Signed)
Second call attempt.  Spoke with someone who said they would give Natalie Valencia message that we called and to call us if questions or concerns.

## 2018-03-25 NOTE — Telephone Encounter (Signed)
First call attempt. Voicemail with telephone number identifier,  Message left to call if questions/concerns.

## 2018-04-09 DIAGNOSIS — J3089 Other allergic rhinitis: Secondary | ICD-10-CM | POA: Diagnosis not present

## 2018-04-17 ENCOUNTER — Ambulatory Visit: Payer: BLUE CROSS/BLUE SHIELD | Admitting: Family Medicine

## 2018-04-17 ENCOUNTER — Encounter: Payer: Self-pay | Admitting: Family Medicine

## 2018-04-17 VITALS — BP 120/64 | HR 77 | Temp 98.4°F | Ht 67.5 in | Wt 169.2 lb

## 2018-04-17 DIAGNOSIS — J45909 Unspecified asthma, uncomplicated: Secondary | ICD-10-CM | POA: Diagnosis not present

## 2018-04-17 DIAGNOSIS — J0120 Acute ethmoidal sinusitis, unspecified: Secondary | ICD-10-CM | POA: Diagnosis not present

## 2018-04-17 MED ORDER — PREDNISONE 20 MG PO TABS: ORAL_TABLET | ORAL | 0 refills | Status: DC

## 2018-04-17 MED ORDER — METHYLPREDNISOLONE ACETATE 80 MG/ML IJ SUSP
80.0000 mg | Freq: Once | INTRAMUSCULAR | Status: AC
  Administered 2018-04-17: 80 mg via INTRAMUSCULAR

## 2018-04-17 MED ORDER — AZITHROMYCIN 250 MG PO TABS
ORAL_TABLET | ORAL | 0 refills | Status: DC
Start: 2018-04-17 — End: 2018-06-20

## 2018-04-17 NOTE — Progress Notes (Signed)
BP 120/64   Pulse 77   Temp 98.4 F (36.9 C) (Oral)   Ht 5' 7.5" (1.715 m)   Wt 169 lb 3.2 oz (76.7 kg)   BMI 26.11 kg/m    Subjective:    Patient ID: Natalie Valencia, female    DOB: Feb 27, 1956, 62 y.o.   MRN: [id number]  HPI: Natalie Valencia is a 62 y.o. female presenting on 04/17/2018 for Sinus Problem and chest congestion with cough   HPI Cough and congestion and wheezing and shortness of breath Patient is coming in today for cough and congestion and wheezing and shortness of breath and sinus pressure and drainage.  She says initially started about 1 week ago with sinus pressure and drainage and a sore throat which improved over the past couple days but then she started getting it down in her chest from all the drainage and she started feeling really tight in her chest and getting wheezing over the past 2 days.  She denies any fevers or chills but is having some shortness of breath along with the wheezing.  She is currently on her asthma medication including Symbicort and albuterol.  She has been using her allergy medication and Zyrtec and Singulair as well which she uses chronically.  She just feels like despite all of these it is just not getting better.  Relevant past medical, surgical, family and social history reviewed and updated as indicated. Interim medical history since our last visit reviewed. Allergies and medications reviewed and updated.  Review of Systems  Constitutional: Negative for chills and fever.  HENT: Positive for congestion, postnasal drip, rhinorrhea, sinus pressure, sneezing and sore throat. Negative for ear discharge and ear pain.   Eyes: Negative for pain, redness and visual disturbance.  Respiratory: Positive for cough and wheezing. Negative for chest tightness and shortness of breath.   Cardiovascular: Negative for chest pain and leg swelling.  Genitourinary: Negative for difficulty urinating and dysuria.  Musculoskeletal: Negative for back pain and gait  problem.  Skin: Negative for rash.  Neurological: Negative for light-headedness and headaches.  Psychiatric/Behavioral: Negative for agitation and behavioral problems.  All other systems reviewed and are negative.   Per HPI unless specifically indicated above   Allergies as of 04/17/2018   No Known Allergies     Medication List        Accurate as of 04/17/18  5:25 PM. Always use your most recent med list.          albuterol 108 (90 Base) MCG/ACT inhaler Commonly known as:  PROVENTIL HFA;VENTOLIN HFA Inhale 1 puff into the lungs every 6 (six) hours as needed for wheezing or shortness of breath.   AMBULATORY NON FORMULARY MEDICATION Allergy Shots Take 1 injection every 2 weeks   azithromycin 250 MG tablet Commonly known as:  ZITHROMAX Take 2 the first day and then one each day after.   BREATHERITE COLL SPACER ADULT Misc 1 each by Does not apply route 4 (four) times daily.   budesonide-formoterol 160-4.5 MCG/ACT inhaler Commonly known as:  SYMBICORT Inhale 2 puffs into the lungs as needed.   CALCIUM 1200 PO Take 1 tablet by mouth daily.   cetirizine 10 MG tablet Commonly known as:  ZYRTEC ALLERGY Take 1 tablet (10 mg total) by mouth daily.   cholecalciferol 1000 units tablet Commonly known as:  VITAMIN D Take 1,000 Units by mouth daily.   fluticasone 50 MCG/ACT nasal spray Commonly known as:  FLONASE Place 2 sprays into both nostrils daily.  Ibuprofen 200 MG Caps Take 1 capsule by mouth as needed.   levothyroxine 50 MCG tablet Commonly known as:  SYNTHROID, LEVOTHROID Take 1 tablet (50 mcg total) by mouth daily.   montelukast 10 MG tablet Commonly known as:  SINGULAIR Take 1 tablet (10 mg total) by mouth at bedtime.   pantoprazole 40 MG tablet Commonly known as:  PROTONIX TAKE ONE TABLET BY MOUTH 30 MINUTES BEFORE BREAKFAST ON AN EMPTY STOMACH   predniSONE 20 MG tablet Commonly known as:  DELTASONE 2 po at same time daily for 5 days   PROBIOTIC  ACIDOPHILUS PO Take 1 tablet by mouth daily.   vitamin B-12 1000 MCG tablet Commonly known as:  CYANOCOBALAMIN Take 1,000 mcg by mouth daily.          Objective:    BP 120/64   Pulse 77   Temp 98.4 F (36.9 C) (Oral)   Ht 5' 7.5" (1.715 m)   Wt 169 lb 3.2 oz (76.7 kg)   BMI 26.11 kg/m   Wt Readings from Last 3 Encounters:  04/17/18 169 lb 3.2 oz (76.7 kg)  03/21/18 166 lb (75.3 kg)  03/07/18 166 lb (75.3 kg)    Physical Exam  Constitutional: She is oriented to person, place, and time. She appears well-developed and well-nourished. No distress.  HENT:  Right Ear: Tympanic membrane, external ear and ear canal normal.  Left Ear: Tympanic membrane, external ear and ear canal normal.  Nose: Mucosal edema and rhinorrhea present. No epistaxis. Right sinus exhibits no maxillary sinus tenderness and no frontal sinus tenderness. Left sinus exhibits no maxillary sinus tenderness and no frontal sinus tenderness.  Mouth/Throat: Uvula is midline and mucous membranes are normal. Posterior oropharyngeal edema and posterior oropharyngeal erythema present. No oropharyngeal exudate or tonsillar abscesses.  Eyes: Conjunctivae are normal.  Cardiovascular: Normal rate, regular rhythm, normal heart sounds and intact distal pulses.  No murmur heard. Pulmonary/Chest: Effort normal. Stridor present. No respiratory distress. She has wheezes.  Musculoskeletal: Normal range of motion. She exhibits no edema or tenderness.  Neurological: She is alert and oriented to person, place, and time. Coordination normal.  Skin: Skin is warm and dry. No rash noted. She is not diaphoretic.  Psychiatric: She has a normal mood and affect. Her behavior is normal.  Nursing note and vitals reviewed.       Assessment & Plan:   Problem List Items Addressed This Visit      Respiratory   Asthma without acute exacerbation - Primary   Relevant Medications   azithromycin (ZITHROMAX) 250 MG tablet   predniSONE  (DELTASONE) 20 MG tablet   methylPREDNISolone acetate (DEPO-MEDROL) injection 80 mg (Start on 04/17/2018  5:30 PM)    Other Visit Diagnoses    Acute non-recurrent ethmoidal sinusitis       Relevant Medications   azithromycin (ZITHROMAX) 250 MG tablet   predniSONE (DELTASONE) 20 MG tablet   methylPREDNISolone acetate (DEPO-MEDROL) injection 80 mg (Start on 04/17/2018  5:30 PM)       Follow up plan: Return if symptoms worsen or fail to improve.  Counseling provided for all of the vaccine components No orders of the defined types were placed in this encounter.   Arville Care, MD [facility] 04/17/2018, 5:25 PM

## 2018-04-23 DIAGNOSIS — J3089 Other allergic rhinitis: Secondary | ICD-10-CM | POA: Diagnosis not present

## 2018-05-15 DIAGNOSIS — J3089 Other allergic rhinitis: Secondary | ICD-10-CM | POA: Diagnosis not present

## 2018-05-21 DIAGNOSIS — J3089 Other allergic rhinitis: Secondary | ICD-10-CM | POA: Diagnosis not present

## 2018-05-24 DIAGNOSIS — J3089 Other allergic rhinitis: Secondary | ICD-10-CM | POA: Diagnosis not present

## 2018-06-05 DIAGNOSIS — J3089 Other allergic rhinitis: Secondary | ICD-10-CM | POA: Diagnosis not present

## 2018-06-09 ENCOUNTER — Other Ambulatory Visit: Payer: Self-pay | Admitting: Pediatrics

## 2018-06-09 DIAGNOSIS — K2970 Gastritis, unspecified, without bleeding: Secondary | ICD-10-CM

## 2018-06-09 DIAGNOSIS — K2990 Gastroduodenitis, unspecified, without bleeding: Principal | ICD-10-CM

## 2018-06-20 ENCOUNTER — Ambulatory Visit (INDEPENDENT_AMBULATORY_CARE_PROVIDER_SITE_OTHER): Payer: BLUE CROSS/BLUE SHIELD | Admitting: Pediatrics

## 2018-06-20 ENCOUNTER — Encounter: Payer: Self-pay | Admitting: Pediatrics

## 2018-06-20 ENCOUNTER — Other Ambulatory Visit: Payer: Self-pay | Admitting: *Deleted

## 2018-06-20 VITALS — BP 136/77 | HR 73 | Temp 97.6°F | Ht 67.5 in | Wt 169.0 lb

## 2018-06-20 DIAGNOSIS — K2990 Gastroduodenitis, unspecified, without bleeding: Secondary | ICD-10-CM

## 2018-06-20 DIAGNOSIS — K2970 Gastritis, unspecified, without bleeding: Secondary | ICD-10-CM

## 2018-06-20 DIAGNOSIS — Z23 Encounter for immunization: Secondary | ICD-10-CM | POA: Diagnosis not present

## 2018-06-20 DIAGNOSIS — Z0000 Encounter for general adult medical examination without abnormal findings: Secondary | ICD-10-CM | POA: Diagnosis not present

## 2018-06-20 DIAGNOSIS — M8580 Other specified disorders of bone density and structure, unspecified site: Secondary | ICD-10-CM | POA: Diagnosis not present

## 2018-06-20 DIAGNOSIS — E039 Hypothyroidism, unspecified: Secondary | ICD-10-CM | POA: Diagnosis not present

## 2018-06-20 MED ORDER — PANTOPRAZOLE SODIUM 40 MG PO TBEC: 40.0000 mg | DELAYED_RELEASE_TABLET | Freq: Every day | ORAL | 3 refills | Status: DC

## 2018-06-20 MED ORDER — LEVOTHYROXINE SODIUM 50 MCG PO TABS: 50.0000 ug | ORAL_TABLET | Freq: Every day | ORAL | 1 refills | Status: DC

## 2018-06-20 NOTE — Progress Notes (Signed)
  Subjective:   Patient ID: Natalie Valencia, female    DOB: 1956/03/18, 62 y.o.   MRN: [id number] CC: Annual Exam  HPI: Natalie Valencia is a 62 y.o. female   Constipation: goes 3-4 days between stools regularly.  Ongoing for the last few months.  Before that she was fairly regular.  Has been taking MiraLAX 3 times a day for the last 2 weeks with minimal production of stool.  She feels like her belly is full.  Appetite has been okay.  Recent colonoscopy that was normal.  Has had a DEXA scan in the past that she says came back with osteopenia, almost osteoporosis.  She has been on prednisone bursts at times for asthma.  Relevant past medical, surgical, family and social history reviewed. Allergies and medications reviewed and updated. Social History   Tobacco Use  Smoking Status Never Smoker  Smokeless Tobacco Never Used   ROS: All systems negative other than what is in the HPI.  Objective:    BP 136/77   Pulse 73   Temp 97.6 F (36.4 C) (Oral)   Ht 5' 7.5" (1.715 m)   Wt 169 lb (76.7 kg)   BMI 26.08 kg/m   Wt Readings from Last 3 Encounters:  06/20/18 169 lb (76.7 kg)  04/17/18 169 lb 3.2 oz (76.7 kg)  03/21/18 166 lb (75.3 kg)    Gen: NAD, alert, cooperative with exam, NCAT EYES: EOMI, no conjunctival injection, or no icterus ENT:  TMs pearly gray b/l, OP without erythema LYMPH: no cervical LAD CV: NRRR, normal S1/S2, no murmur, distal pulses 2+ b/l Resp: CTABL, no wheezes, normal WOB Abd: +BS, soft, NTND. no guarding or organomegaly Ext: No edema, warm Neuro: Alert and oriented, strength equal b/l UE and LE, coordination grossly normal MSK: normal muscle bulk Skin: Red 1 cm plaque posterior ankle with small amount of white scaling.  Assessment & Plan:  Natalie Valencia was seen today for annual exam.  Diagnoses and all orders for this visit:  Encounter for preventive health examination  Osteopenia, unspecified location We will repeat DEXA scan. -     BMP8+EGFR -      VITAMIN D 25 Hydroxy (Vit-D Deficiency, Fractures) -     DG [facility] DEXA  Hypothyroidism, unspecified type -     TSH -     levothyroxine (SYNTHROID, LEVOTHROID) 50 MCG tablet; Take 1 tablet (50 mcg total) by mouth daily.  Gastritis and gastroduodenitis Stable, continue below, has symptoms when she stops. -     pantoprazole (PROTONIX) 40 MG tablet; Take 1 tablet (40 mg total) by mouth daily.  Other orders -     Tdap vaccine greater than or equal to 7yo IM  Constipation Take mag citrate this weekend to help with cleanout.  Then do trial of Linzess 145 mcg, samples given.  Follow up plan: Return in about 6 months (around 12/21/2018). Assunta Found, MD [place]

## 2018-06-21 LAB — VITAMIN D 25 HYDROXY (VIT D DEFICIENCY, FRACTURES): Vit D, 25-Hydroxy: 32.6 ng/mL (ref 30.0–100.0)

## 2018-06-21 LAB — BMP8+EGFR
BUN/Creatinine Ratio: 16 (ref 12–28)
BUN: 13 mg/dL (ref 8–27)
CHLORIDE: 104 mmol/L (ref 96–106)
CO2: 25 mmol/L (ref 20–29)
Calcium: 9.4 mg/dL (ref 8.7–10.3)
Creatinine, Ser: 0.79 mg/dL (ref 0.57–1.00)
GFR, EST AFRICAN AMERICAN: 93 mL/min/{1.73_m2} (ref >59)
GFR, EST NON AFRICAN AMERICAN: 80 mL/min/{1.73_m2} (ref >59)
Glucose: 122 mg/dL — ABNORMAL HIGH (ref 65–99)
POTASSIUM: 3.8 mmol/L (ref 3.5–5.2)
Sodium: 143 mmol/L (ref 134–144)

## 2018-06-21 LAB — TSH: TSH: 3.29 u[IU]/mL (ref 0.450–4.500)

## 2018-07-02 DIAGNOSIS — J3089 Other allergic rhinitis: Secondary | ICD-10-CM | POA: Diagnosis not present

## 2018-07-14 ENCOUNTER — Telehealth: Payer: Self-pay | Admitting: Pediatrics

## 2018-07-14 NOTE — Telephone Encounter (Signed)
OK to send in 30 day supply with 5 refills, can you recheck what dose she was taking

## 2018-07-14 NOTE — Telephone Encounter (Signed)
LM to find out what dose she is on.Selinda Eon. jhb 9/16

## 2018-07-15 ENCOUNTER — Telehealth: Payer: Self-pay | Admitting: Pediatrics

## 2018-07-15 DIAGNOSIS — J3089 Other allergic rhinitis: Secondary | ICD-10-CM | POA: Diagnosis not present

## 2018-07-16 MED ORDER — LINACLOTIDE 145 MCG PO CAPS: 145.0000 ug | ORAL_CAPSULE | Freq: Every day | ORAL | 5 refills | Status: DC

## 2018-07-16 NOTE — Telephone Encounter (Signed)
Left message to call back  

## 2018-07-16 NOTE — Telephone Encounter (Signed)
Patient reports she is taking Linzess 145 mg.  Prescription sent in for her

## 2018-07-21 ENCOUNTER — Other Ambulatory Visit: Payer: Self-pay | Admitting: Pediatrics

## 2018-07-21 MED ORDER — BUDESONIDE-FORMOTEROL FUMARATE 160-4.5 MCG/ACT IN AERO: 2.0000 | INHALATION_SPRAY | RESPIRATORY_TRACT | 1 refills | Status: DC | PRN

## 2018-07-21 NOTE — Telephone Encounter (Signed)
What is the name of the medication? SYMBICORT) 160-4.5 MCG/   Have you contacted your pharmacy to request a refill? yes  Which pharmacy would you like this sent to? [facility] [place]   Patient notified that their request is being sent to the clinical staff for review and that they should receive a call once it is complete. If they do not receive a call within 24 hours they can check with their pharmacy or our office.

## 2018-07-30 DIAGNOSIS — J3089 Other allergic rhinitis: Secondary | ICD-10-CM | POA: Diagnosis not present

## 2018-08-14 DIAGNOSIS — J3089 Other allergic rhinitis: Secondary | ICD-10-CM | POA: Diagnosis not present

## 2018-08-14 DIAGNOSIS — J4541 Moderate persistent asthma with (acute) exacerbation: Secondary | ICD-10-CM | POA: Diagnosis not present

## 2018-08-27 DIAGNOSIS — J3089 Other allergic rhinitis: Secondary | ICD-10-CM | POA: Diagnosis not present

## 2018-09-04 DIAGNOSIS — Z1231 Encounter for screening mammogram for malignant neoplasm of breast: Secondary | ICD-10-CM | POA: Diagnosis not present

## 2018-09-04 LAB — HM MAMMOGRAPHY

## 2018-09-09 ENCOUNTER — Other Ambulatory Visit: Payer: Self-pay | Admitting: Pediatrics

## 2018-09-09 DIAGNOSIS — K2970 Gastritis, unspecified, without bleeding: Secondary | ICD-10-CM

## 2018-09-09 DIAGNOSIS — K2990 Gastroduodenitis, unspecified, without bleeding: Principal | ICD-10-CM

## 2018-09-17 DIAGNOSIS — J3089 Other allergic rhinitis: Secondary | ICD-10-CM | POA: Diagnosis not present

## 2018-09-29 DIAGNOSIS — J3089 Other allergic rhinitis: Secondary | ICD-10-CM | POA: Diagnosis not present

## 2018-10-15 DIAGNOSIS — J3089 Other allergic rhinitis: Secondary | ICD-10-CM | POA: Diagnosis not present

## 2018-11-05 DIAGNOSIS — J3089 Other allergic rhinitis: Secondary | ICD-10-CM | POA: Diagnosis not present

## 2018-11-16 DIAGNOSIS — J3089 Other allergic rhinitis: Secondary | ICD-10-CM | POA: Diagnosis not present

## 2018-11-27 ENCOUNTER — Telehealth: Payer: Self-pay | Admitting: Pediatrics

## 2018-12-02 DIAGNOSIS — J3089 Other allergic rhinitis: Secondary | ICD-10-CM | POA: Diagnosis not present

## 2018-12-30 DIAGNOSIS — J3089 Other allergic rhinitis: Secondary | ICD-10-CM | POA: Diagnosis not present

## 2019-01-27 DIAGNOSIS — J3089 Other allergic rhinitis: Secondary | ICD-10-CM | POA: Diagnosis not present

## 2019-02-24 DIAGNOSIS — J3089 Other allergic rhinitis: Secondary | ICD-10-CM | POA: Diagnosis not present

## 2019-03-11 DIAGNOSIS — L821 Other seborrheic keratosis: Secondary | ICD-10-CM | POA: Diagnosis not present

## 2019-03-11 DIAGNOSIS — D1801 Hemangioma of skin and subcutaneous tissue: Secondary | ICD-10-CM | POA: Diagnosis not present

## 2019-03-11 DIAGNOSIS — L820 Inflamed seborrheic keratosis: Secondary | ICD-10-CM | POA: Diagnosis not present

## 2019-03-11 DIAGNOSIS — L738 Other specified follicular disorders: Secondary | ICD-10-CM | POA: Diagnosis not present

## 2019-03-11 DIAGNOSIS — L720 Epidermal cyst: Secondary | ICD-10-CM | POA: Diagnosis not present

## 2019-03-11 DIAGNOSIS — L814 Other melanin hyperpigmentation: Secondary | ICD-10-CM | POA: Diagnosis not present

## 2019-03-30 DIAGNOSIS — J3089 Other allergic rhinitis: Secondary | ICD-10-CM | POA: Diagnosis not present

## 2019-05-13 DIAGNOSIS — J3089 Other allergic rhinitis: Secondary | ICD-10-CM | POA: Diagnosis not present

## 2019-05-20 DIAGNOSIS — J3089 Other allergic rhinitis: Secondary | ICD-10-CM | POA: Diagnosis not present

## 2019-06-03 DIAGNOSIS — J3089 Other allergic rhinitis: Secondary | ICD-10-CM | POA: Diagnosis not present

## 2019-06-17 DIAGNOSIS — J3089 Other allergic rhinitis: Secondary | ICD-10-CM | POA: Diagnosis not present

## 2019-07-01 DIAGNOSIS — J3089 Other allergic rhinitis: Secondary | ICD-10-CM | POA: Diagnosis not present

## 2019-07-15 DIAGNOSIS — J3089 Other allergic rhinitis: Secondary | ICD-10-CM | POA: Diagnosis not present

## 2019-08-04 DIAGNOSIS — J3089 Other allergic rhinitis: Secondary | ICD-10-CM | POA: Diagnosis not present

## 2019-08-05 ENCOUNTER — Other Ambulatory Visit: Payer: Self-pay | Admitting: *Deleted

## 2019-08-05 DIAGNOSIS — J3089 Other allergic rhinitis: Secondary | ICD-10-CM | POA: Diagnosis not present

## 2019-08-05 MED ORDER — BUDESONIDE-FORMOTEROL FUMARATE 160-4.5 MCG/ACT IN AERO: 2.0000 | INHALATION_SPRAY | RESPIRATORY_TRACT | 0 refills | Status: DC | PRN

## 2019-08-05 NOTE — Telephone Encounter (Signed)
Dettinger. NTBS LOV 06/20/18

## 2019-08-05 NOTE — Addendum Note (Signed)
Addended by: Thana Ates on: 08/05/2019 04:34 PM   Modules accepted: Orders

## 2019-08-05 NOTE — Telephone Encounter (Signed)
Appointment scheduled. Rx filled for 1 month until appointment

## 2019-08-19 DIAGNOSIS — J3089 Other allergic rhinitis: Secondary | ICD-10-CM | POA: Diagnosis not present

## 2019-08-28 ENCOUNTER — Other Ambulatory Visit: Payer: Self-pay

## 2019-08-28 ENCOUNTER — Encounter: Payer: Self-pay | Admitting: Family Medicine

## 2019-08-28 ENCOUNTER — Ambulatory Visit: Payer: BLUE CROSS/BLUE SHIELD | Admitting: Family Medicine

## 2019-08-28 VITALS — BP 114/64 | HR 67 | Temp 96.9°F | Resp 20 | Ht 67.0 in | Wt 163.0 lb

## 2019-08-28 DIAGNOSIS — J45909 Unspecified asthma, uncomplicated: Secondary | ICD-10-CM

## 2019-08-28 DIAGNOSIS — I10 Essential (primary) hypertension: Secondary | ICD-10-CM | POA: Diagnosis not present

## 2019-08-28 DIAGNOSIS — E039 Hypothyroidism, unspecified: Secondary | ICD-10-CM | POA: Diagnosis not present

## 2019-08-28 DIAGNOSIS — E785 Hyperlipidemia, unspecified: Secondary | ICD-10-CM | POA: Diagnosis not present

## 2019-08-28 DIAGNOSIS — Z23 Encounter for immunization: Secondary | ICD-10-CM

## 2019-08-28 DIAGNOSIS — Z6825 Body mass index (BMI) 25.0-25.9, adult: Secondary | ICD-10-CM

## 2019-08-28 DIAGNOSIS — Z13820 Encounter for screening for osteoporosis: Secondary | ICD-10-CM

## 2019-08-28 DIAGNOSIS — J309 Allergic rhinitis, unspecified: Secondary | ICD-10-CM

## 2019-08-28 MED ORDER — FLUTICASONE PROPIONATE 50 MCG/ACT NA SUSP: 2.0000 | Freq: Every day | NASAL | 6 refills | Status: DC

## 2019-08-28 MED ORDER — LEVOTHYROXINE SODIUM 50 MCG PO TABS: 50.0000 ug | ORAL_TABLET | Freq: Every day | ORAL | 1 refills | Status: DC

## 2019-08-28 NOTE — Progress Notes (Signed)
Subjective:  Patient ID: Natalie Valencia, female    DOB: 1956-03-19, 63 y.o.   MRN: [id number]  Patient Care Team: Baruch Gouty, FNP as PCP - General (Family Medicine)   Chief Complaint:  Medical Management of Chronic Issues (Former patient of Evette Doffing)   HPI: Natalie Valencia is a 63 y.o. female presenting on 08/28/2019 for Medical Management of Chronic Issues (Former patient of Evette Doffing)   HPI   1. Hypothyroidism Reports she has not taken Synthroid in 5 months as she as been unable to get refills without a visit. She has noticed that her hair is falling out more and that she is more tired than usual for the last few months.   2, Asthma She takes Symbicort BID as prescribed and feels like it controls her symptoms well. She has uses her albuterol inhaler a few times a month if she feels short of breath with good relief.   3. Hypertension She does not take medication for her BP. She does not check her BP at home. She does not exercise. Her diet is "okay".  4. Hyperlipidemia She does not take cholesterol medication. Does not exercise and reports "okay" diet.    Relevant past medical, surgical, family, and social history reviewed and updated as indicated.  Allergies and medications reviewed and updated. Date reviewed: Chart in Epic.   Past Medical History:  Diagnosis Date  . Allergy   . Asthma   . Back pain   . GERD (gastroesophageal reflux disease)   . Hyperlipidemia    borderline diet controlled  . Hypertension    situational no meds  . Osteopenia   . Thyroid disease     Past Surgical History:  Procedure Laterality Date  . ABDOMINAL HYSTERECTOMY  2001  . COLONOSCOPY    . NASAL SINUS SURGERY    . UPPER GASTROINTESTINAL ENDOSCOPY      Social History   Socioeconomic History  . Marital status: Married    Spouse name: Not on file  . Number of children: 2  . Years of education: Not on file  . Highest education level: Not on file  Occupational History  .  Occupation: Loss adjuster, [employer]  Social Needs  . Financial resource strain: Not on file  . Food insecurity    Worry: Not on file    Inability: Not on file  . Transportation needs    Medical: Not on file    Non-medical: Not on file  Tobacco Use  . Smoking status: Never Smoker  . Smokeless tobacco: Never Used  Substance and Sexual Activity  . Alcohol use: No  . Drug use: No  . Sexual activity: Not on file  Lifestyle  . Physical activity    Days per week: Not on file    Minutes per session: Not on file  . Stress: Not on file  Relationships  . Social Herbalist on phone: Not on file    Gets together: Not on file    Attends religious service: Not on file    Active member of club or organization: Not on file    Attends meetings of clubs or organizations: Not on file    Relationship status: Not on file  . Intimate partner violence    Fear of current or ex partner: Not on file    Emotionally abused: Not on file    Physically abused: Not on file    Forced sexual activity: Not on file  Other Topics Concern  .  Not on file  Social History Narrative  . Not on file    Outpatient Encounter Medications as of 08/28/2019  Medication Sig  . albuterol (VENTOLIN HFA) 108 (90 Base) MCG/ACT inhaler   . AMBULATORY NON FORMULARY MEDICATION Allergy Shots Take 1 injection every 2 weeks  . aspirin EC 81 MG tablet Take by mouth.  . Biotin 2500 MCG CAPS Take by mouth.  . budesonide-formoterol (SYMBICORT) 160-4.5 MCG/ACT inhaler Inhale 2 puffs into the lungs as needed.  . Calcium Carbonate-Vit D-Min (CALCIUM 1200 PO) Take 1 tablet by mouth daily.  . cetirizine (ZYRTEC ALLERGY) 10 MG tablet Take 1 tablet (10 mg total) by mouth daily.  . cholecalciferol (VITAMIN D) 1000 units tablet Take 1,000 Units by mouth daily.  Marland Kitchen guaiFENesin (MUCINEX) 600 MG 12 hr tablet Take by mouth 2 (two) times daily.  . Ibuprofen 200 MG CAPS Take 1 capsule by mouth as needed.  . Lactobacillus (PROBIOTIC ACIDOPHILUS PO)  Take 1 tablet by mouth daily.  . montelukast (SINGULAIR) 10 MG tablet Take 1 tablet (10 mg total) by mouth at bedtime.  . pantoprazole (PROTONIX) 40 MG tablet TAKE ONE TABLET BY MOUTH 30 MINUTES BEFORE BREAKFAST ON AN EMPTY STOMACH  . vitamin B-12 (CYANOCOBALAMIN) 1000 MCG tablet Take 1,000 mcg by mouth daily.  . fluticasone (FLONASE) 50 MCG/ACT nasal spray Place 2 sprays into both nostrils daily.  Marland Kitchen levothyroxine (SYNTHROID) 50 MCG tablet Take 1 tablet (50 mcg total) by mouth daily.  Marland Kitchen linaclotide (LINZESS) 145 MCG CAPS capsule Take 1 capsule (145 mcg total) by mouth daily before breakfast. (Patient not taking: Reported on 08/28/2019)  . polyethylene glycol (MIRALAX / GLYCOLAX) packet Take 17 g by mouth daily.  . [DISCONTINUED] fluticasone (FLONASE) 50 MCG/ACT nasal spray Place 2 sprays into both nostrils daily. (Patient not taking: Reported on 08/28/2019)  . [DISCONTINUED] levothyroxine (SYNTHROID, LEVOTHROID) 50 MCG tablet Take 1 tablet (50 mcg total) by mouth daily. (Patient not taking: Reported on 08/28/2019)   Facility-Administered Encounter Medications as of 08/28/2019  Medication  . 0.9 %  sodium chloride infusion    No Known Allergies  Review of Systems  Constitutional: Positive for fatigue. Negative for activity change, appetite change, chills, fever and unexpected weight change.  HENT: Negative for trouble swallowing.   Eyes: Negative for visual disturbance.  Respiratory: Negative for chest tightness, shortness of breath and wheezing.   Cardiovascular: Negative for chest pain, palpitations and leg swelling.  Gastrointestinal: Negative for constipation, diarrhea, nausea and vomiting.  Genitourinary: Negative for difficulty urinating and vaginal discharge.  Musculoskeletal: Negative for arthralgias and myalgias.  Skin: Negative for rash and wound.  Neurological: Negative for dizziness, tremors, facial asymmetry, speech difficulty, weakness and light-headedness.   Psychiatric/Behavioral: Negative for agitation, confusion, decreased concentration, dysphoric mood and sleep disturbance. The patient is not nervous/anxious.         Objective:  BP 114/64   Pulse 67   Temp (!) 96.9 F (36.1 C) (Temporal)   Resp 20   Ht 5' 7"  (1.702 m)   Wt 163 lb (73.9 kg)   SpO2 98%   BMI 25.53 kg/m    Wt Readings from Last 3 Encounters:  08/28/19 163 lb (73.9 kg)  06/20/18 169 lb (76.7 kg)  04/17/18 169 lb 3.2 oz (76.7 kg)    Physical Exam  Results for orders placed or performed in visit on 09/08/18  [facility] MAMMOGRAPHY  Result Value Ref Range   HM Mammogram 0-4 Bi-Rad 0-4 Bi-Rad, Self Reported Normal  Pertinent labs & imaging results that were available during my care of the patient were reviewed by me and considered in my medical decision making.  Assessment & Plan:  Natalie Valencia was seen today for medical management of chronic issues.  Diagnoses and all orders for this visit:  Hypothyroidism, unspecified type Restart Synthroid 50 mg. Labs are pending. Adjustments to regimen will be made if warranted. Make sure to take medications on an empty stomach with a full glass of water. Make sure to avoid vitamins or supplements for at least 4 hours before and 4 hours after taking medications. Repeat labs in 3 months if adjustments are made and in 6 months if stable.   -     Thyroid Panel With TSH  Asthma without acute exacerbation Continue Symbicort and Albuterol as prescribed. Notify provider for worsening or new symptoms.   Essential hypertension, benign BP controlled. Goal 130/90. DASH diet and exercise encouraged. Exercise at least 150 minutes per week and increase as tolerated. Goal BMI > 25. Stress management encouraged. Avoid nicotine and tobacco product use. Avoid excessive alcohol and NSAID's. Avoid more than 2000 mg of sodium daily. Medications as prescribed. Follow up as scheduled.  -     CMP14+EGFR -     CBC with Differential/Platelet   Hyperlipidemia with target LDL less than 100 Diet encouraged - increase intake of fresh fruits and vegetables, increase intake of lean proteins. Bake, broil, or grill foods. Avoid fried, greasy, and fatty foods. Avoid fast foods. Increase intake of fiber-rich whole grains. Exercise encouraged - at least 150 minutes per week and advance as tolerated.  Goal BMI < 25. Continue medications as prescribed. Follow up in 3-6 months as discussed.  -     Lipid panel  Screening for osteoporosis Discussed Vitamin D and calcium supplement and dietary changes. Discussed low impact exercise.  -     DG [facility] DEXA  BMI 25.0-25.9,adult Diet and exercise encouraged.  -     CMP14+EGFR -     CBC with Differential/Platelet -     Lipid panel -     Thyroid Panel With TSH   Influenza vaccination today in office.  Continue all other maintenance medications.  Follow up plan: Return in about 3 months (around 11/28/2019), or if symptoms worsen or fail to improve, for Thyroid.  Continue healthy lifestyle choices, including diet (rich in fruits, vegetables, and lean proteins, and low in salt and simple carbohydrates) and exercise (at least 30 minutes of moderate physical activity daily).  Educational handout given for hypothyroidism, osteopenia.   The above assessment and management plan was discussed with the patient. The patient verbalized understanding of and has agreed to the management plan. Patient is aware to call the clinic if they develop any new symptoms or if symptoms persist or worsen. Patient is aware when to return to the clinic for a follow-up visit. Patient educated on when it is appropriate to go to the emergency department.   Marjorie Smolder, RN, FNP student [facility] [phone]   I personally was present during the history, physical exam, and medical decision-making activities of this service and have verified that the service and findings are accurately documented in  the nurse practitioner student's note.  Monia Pouch, FNP-C [facility] [address] [place], [place] [address] [phone]

## 2019-08-28 NOTE — Patient Instructions (Addendum)
Hypothyroidism  Hypothyroidism is when the thyroid gland does not make enough of certain hormones (it is underactive). The thyroid gland is a small gland located in the lower front part of the neck, just in front of the windpipe (trachea). This gland makes hormones that help control how the body uses food for energy (metabolism) as well as how the heart and brain function. These hormones also play a role in keeping your bones strong. When the thyroid is underactive, it produces too little of the hormones thyroxine (T4) and triiodothyronine (T3). What are the causes? This condition may be caused by:  Hashimoto's disease. This is a disease in which the body's disease-fighting system (immune system) attacks the thyroid gland. This is the most common cause.  Viral infections.  Pregnancy.  Certain medicines.  Birth defects.  Past radiation treatments to the head or neck for cancer.  Past treatment with radioactive iodine.  Past exposure to radiation in the environment.  Past surgical removal of part or all of the thyroid.  Problems with a gland in the center of the brain (pituitary gland).  Lack of enough iodine in the diet. What increases the risk? You are more likely to develop this condition if:  You are female.  You have a family history of thyroid conditions.  You use a medicine called lithium.  You take medicines that affect the immune system (immunosuppressants). What are the signs or symptoms? Symptoms of this condition include:  Feeling as though you have no energy (lethargy).  Not being able to tolerate cold.  Weight gain that is not explained by a change in diet or exercise habits.  Lack of appetite.  Dry skin.  Coarse hair.  Menstrual irregularity.  Slowing of thought processes.  Constipation.  Sadness or depression. How is this diagnosed? This condition may be diagnosed based on:  Your symptoms, your medical history, and a physical exam.  Blood  tests. You may also have imaging tests, such as an ultrasound or MRI. How is this treated? This condition is treated with medicine that replaces the thyroid hormones that your body does not make. After you begin treatment, it may take several weeks for symptoms to go away. Follow these instructions at home:  Take over-the-counter and prescription medicines only as told by your health care provider.  If you start taking any new medicines, tell your health care provider.  Keep all follow-up visits as told by your health care provider. This is important. ? As your condition improves, your dosage of thyroid hormone medicine may change. ? You will need to have blood tests regularly so that your health care provider can monitor your condition. Contact a health care provider if:  Your symptoms do not get better with treatment.  You are taking thyroid replacement medicine and you: ? Sweat a lot. ? Have tremors. ? Feel anxious. ? Lose weight rapidly. ? Cannot tolerate heat. ? Have emotional swings. ? Have diarrhea. ? Feel weak. Get help right away if you have:  Chest pain.  An irregular heartbeat.  A rapid heartbeat.  Difficulty breathing. Summary  Hypothyroidism is when the thyroid gland does not make enough of certain hormones (it is underactive).  When the thyroid is underactive, it produces too little of the hormones thyroxine (T4) and triiodothyronine (T3).  The most common cause is Hashimoto's disease, a disease in which the body's disease-fighting system (immune system) attacks the thyroid gland. The condition can also be caused by viral infections, medicine, pregnancy, or past  radiation treatment to the head or neck.  Symptoms may include weight gain, dry skin, constipation, feeling as though you do not have energy, and not being able to tolerate cold.  This condition is treated with medicine to replace the thyroid hormones that your body does not make. This information  is not intended to replace advice given to you by your health care provider. Make sure you discuss any questions you have with your health care provider. Document Released: 10/15/2005 Document Revised: 09/27/2017 Document Reviewed: 09/25/2017 Elsevier Patient Education  2020 [employer].   Your recent bone density scan demonstrated that you have osteopenia.  Osteopenia is when your bones become thinner and weaker.  This is not osteoporosis but can become osteoporosis over time.  Today, I provided you a handout reviewing calcium and vitamin D and ways to incorporate this in your diet.  You may need to take a supplement if you are unable to get sufficient amount of these minerals through food.  Strength training is just as important in maintaining bone health.  A copy of home exercises has been provided to you. You need to repeat your bone density in 2 years.   Exercise for Strong Bones  Exercise is important to build and maintain strong bones / bone density.  There are 2 types of exercises that are important to building and maintaining strong bones:  Weight- bearing and muscle-stregthening.  Weight-bearing Exercises  These exercises include activities that make you move against gravity while staying upright. Weight-bearing exercises can be high-impact or low-impact.  High-impact weight-bearing exercises help build bones and keep them strong. If you have broken a bone due to osteoporosis or are at risk of breaking a bone, you may need to avoid high-impact exercises. If you're not sure, you should check with your healthcare provider.  Examples of high-impact weight-bearing exercises are: . Dancing  . Doing high-impact aerobics  . Hiking  . Jogging/running  . Jumping Rope  . Stair climbing  . Tennis  Low-impact weight-bearing exercises can also help keep bones strong and are a safe alternative if you cannot do high-impact exercises.   Examples of low-impact weight-bearing exercises are: .  Using elliptical training machines  . Doing low-impact aerobics  . Using stair-step machines  . Fast walking on a treadmill or outside   Muscle-Strengthening Exercises These exercises include activities where you move your body, a weight or some other resistance against gravity. They are also known as resistance exercises and include: . Lifting weights  . Using elastic exercise bands  . Using weight machines  . Lifting your own body weight  . Functional movements, such as standing and rising up on your toes  Yoga and Pilates can also improve strength, balance and flexibility. However, certain positions may not be safe for people with osteoporosis or those at increased risk of broken bones. For example, exercises that have you bend forward may increase the chance of breaking a bone in the spine.   Non-Impact Exercises There are other types of exercises that can help prevent falls.  Non-impact exercises can help you to improve balance, posture and how well you move in everyday activities. Some of these exercises include: . Balance exercises that strengthen your legs and test your balance, such as Tai Chi, can decrease your risk of falls.  . Posture exercises that improve your posture and reduce rounded or "sloping" shoulders can help you decrease the chance of breaking a bone, especially in the spine.  . Functional exercises  that improve how well you move can help you with everyday activities and decrease your chance of falling and breaking a bone. For example, if you have trouble getting up from a chair or climbing stairs, you should do these activities as exercises.   A physical therapist can teach you balance, posture and functional exercises. He/she can also help you learn which exercises are safe and appropriate for you.  [facility] has a physical therapy office in [place] in front of our office and referrals can be made for assessments and treatment as needed and strength and balance  training.  If you would like to have an assessment with [facility] and our physical therapy team please let a nurse or provider know.  For more information go to the [facility] website: [email or web address].   . Click the striped box in the right upper corner.   . Then click through patient info in the left lower hand corner to out more about osteoporosis and osteopenia and how you can prevent fractures.   Follow these instructions at home:  Include calcium and vitamin D in your diet. Calcium is important for bone health, and vitamin D helps the body absorb calcium. Os-Cal is a great over the counter supplement to take daily. It has calcium and Vitamin D.   Perform weight-bearing and muscle-strengthening exercises as directed by your health care provider.   Do not use any tobacco products, including cigarettes, chewing tobacco, and electronic cigarettes. If you need help quitting, ask your health care provider.  Limit your alcohol intake.  Take medicines only as directed by your health care provider.  Keep all follow-up visits as directed by your health care provider. This is important.  Take precautions at home to lower your risk of falling, such as: ? Keeping rooms well lit and clutter free. ? Installing safety rails on stairs. ? Using rubber mats in the bathroom and other areas that are often wet or slippery.     Calcium & Vitamin D: The Facts  Why is calcium and vitamin D consumption important? Calcium: . Most Americans do not consume adequate amounts of calcium! Calcium is required for proper muscle function, nerve communication, bone support, and many other functions in the body.  . The body uses bones as a source of calcium. Bones 'remodel' themselves continuously - the body constantly breaks bone down to release calcium and rebuilds bones by replacing calcium in the bone later.  . As we get older, the rate of bone breakdown occurs faster than bone rebuilding  which could lead to osteopenia, osteoporosis, and possible fractures.   Vitamin D: . People naturally make vitamin D in the body when sunlight hits the skin and triggers a process that leads to vitamin D production. This natural vitamin D production requires about 10-15 minutes of sun exposure on the hands, arms, and face at least 2-3 times per week. However, due to decreased sun exposure and the use of sunscreen, most people will need to get additional vitamin D from foods or supplements. Your doctor can measure your body's vitamin D level through a simple blood test to determine your daily vitamin D needs.  . Vitamin D is used to help the body absorb calcium, maintain bone health, help the immune system, and reduce inflammation. It also plays a role in muscle performance, balance and risk of falling.  . Vitamin D deficiency can lead to osteomalacia or softening of the bones, bone pain, and muscle weakness.   The recommended  daily allowance of Calcium and Vitamin D varies for different age groups. Age group Calcium (mg) Vitamin D (IU)  Females and Males: Age 59-50 1000 mg 600 IU  Females: Age 85- 3 1200 mg 600 IU  Males: Age 42-70 1000 mg 600 IU  Females and Males: Age 53+ 1200 mg 800 IU  Pregnant/lactating Females age 19-50 1000 mg 600 IU   How much Calcium do you get in your diet? Calcium Intake # of servings per day  Total calcium (mg)  Skim milk, 2% milk (1 cup) _________ x 300 mg   Yogurt (1 small container) _________ x 200 mg   Cheese (1oz) _________ x 200 mg   Cottage Cheese (1 cup)             ________ x 150 mg   Almond milk (1 cup) _________ x 450 mg   Fortified Orange Juice (1 cup) _________ x 300 mg   Broccoli or spinach ( 1 cup) _________ x 100 mg   Salmon (3 oz) _________ x 150 mg    Almonds (1/4 cup) _______ x 90 mg      How do we get Calcium and Vitamin D in our diet? Calcium: . Obtaining calcium from the diet is the most preferred way to reach the recommended daily  goal. If this goal is not reached through diet, calcium supplements are available.  . Calcium is found in many foods including: dairy products, dark leafy vegetables (like broccoli, kale, and spinach), fish, and fortified products like juices and cereals.  . The food label will have a %DV (percent daily value) listed showing the amount of calcium per serving. To determine the total mg per serving, simply replace the % with zero (0).  For example, Almond Breeze almond milk contains 45% DV of calcium or 473m per 1 cup.  . You can increase the amount of calcium in your diet by using more calcium products in your daily meals. Use yogurt and fruit to make smoothies or use yogurt to top baked potatoes or make whipped potatoes. Sprinkle low fat cheese onto salads or into egg white omelets. You can even add non-fat dry milk powder (309mcalcium per 1/3 cup) to hot cereals, meat loaf, soups, or potatoes.  . Calcium supplements come in many forms including tablets, chewables, and gummies. Be sure to read the label to determine the correct number of tablets per serving and whether or not to take the supplement with food.  . Calcium carbonate products (Oscal, Caltrate, and Viactiv) are generally better absorbed when taken with food while calcium citrate products like Citracal can be taken with or without food.  . The body can only absorb about 600 mg of calcium at one time. It is recommended to take calcium supplements in small amounts several times per day.  However, taking it all at once is better than not taking it at all. . Increasing your intake of calcium is essential for bone health, but may also lead to some side effects like constipation, increased gas, bloating or abdominal cramping. To help reduce these side effects, start with 1 tablet per day and slowly increase your intake of the supplement to the recommended doses. It is also recommended that you drink plenty of water each day. Vitamin D: . Very few  foods naturally contain vitamin D. However, it is found in saltwater fish (like tuna, salmon and mackerel), beef liver, egg yolks, cheese and vitamin D fortified foods (like yogurt, cereals, orange juice and  milk) . The amount of vitamin D in each food or product is listed as %DV on the product label. To determine the total amount of vitamin D per serving, drop the % sign and multiply the number by 4. For example, 1 cup of Almond Breeze almond milk contains 25% DV vitamin D or 100 IU per serving (25 x 4 =100). . Vitamin D is also found in multivitamins and supplements and may be listed as ergocalciferol (vitamin D2) or cholecalciferol (vitamin D3). Each of these forms of vitamin D are equivalent and the daily recommended intake will vary based on your age and the vitamin D levels in your body. Follow your doctor's recommendation for vitamin D intake.

## 2019-08-29 LAB — CBC WITH DIFFERENTIAL/PLATELET
Basophils Absolute: 0.1 10*3/uL (ref 0.0–0.2)
Basos: 1 %
EOS (ABSOLUTE): 2.2 10*3/uL — ABNORMAL HIGH (ref 0.0–0.4)
Eos: 27 %
Hematocrit: 40.5 % (ref 34.0–46.6)
Hemoglobin: 13.6 g/dL (ref 11.1–15.9)
Immature Grans (Abs): 0 10*3/uL (ref 0.0–0.1)
Immature Granulocytes: 0 %
Lymphocytes Absolute: 2.4 10*3/uL (ref 0.7–3.1)
Lymphs: 30 %
MCH: 29.6 pg (ref 26.6–33.0)
MCHC: 33.6 g/dL (ref 31.5–35.7)
MCV: 88 fL (ref 79–97)
Monocytes Absolute: 0.6 10*3/uL (ref 0.1–0.9)
Monocytes: 7 %
Neutrophils Absolute: 2.8 10*3/uL (ref 1.4–7.0)
Neutrophils: 35 %
Platelets: 319 10*3/uL (ref 150–450)
RBC: 4.6 x10E6/uL (ref 3.77–5.28)
RDW: 12.4 % (ref 11.7–15.4)
WBC: 8.2 10*3/uL (ref 3.4–10.8)

## 2019-08-29 LAB — CMP14+EGFR
ALT: 21 IU/L (ref 0–32)
AST: 21 IU/L (ref 0–40)
Albumin/Globulin Ratio: 1.7 (ref 1.2–2.2)
Albumin: 4.3 g/dL (ref 3.8–4.8)
Alkaline Phosphatase: 84 IU/L (ref 39–117)
BUN/Creatinine Ratio: 18 (ref 12–28)
BUN: 13 mg/dL (ref 8–27)
Bilirubin Total: 0.2 mg/dL (ref 0.0–1.2)
CO2: 25 mmol/L (ref 20–29)
Calcium: 9.8 mg/dL (ref 8.7–10.3)
Chloride: 103 mmol/L (ref 96–106)
Creatinine, Ser: 0.73 mg/dL (ref 0.57–1.00)
GFR calc Af Amer: 101 mL/min/{1.73_m2} (ref >59)
GFR calc non Af Amer: 88 mL/min/{1.73_m2} (ref >59)
Globulin, Total: 2.6 g/dL (ref 1.5–4.5)
Glucose: 87 mg/dL (ref 65–99)
Potassium: 4.1 mmol/L (ref 3.5–5.2)
Sodium: 140 mmol/L (ref 134–144)
Total Protein: 6.9 g/dL (ref 6.0–8.5)

## 2019-08-29 LAB — LIPID PANEL
Chol/HDL Ratio: 3.2 ratio (ref 0.0–4.4)
Cholesterol, Total: 225 mg/dL — ABNORMAL HIGH (ref 100–199)
HDL: 70 mg/dL (ref >39)
LDL Chol Calc (NIH): 131 mg/dL — ABNORMAL HIGH (ref 0–99)
Triglycerides: 139 mg/dL (ref 0–149)
VLDL Cholesterol Cal: 24 mg/dL (ref 5–40)

## 2019-08-29 LAB — THYROID PANEL WITH TSH
Free Thyroxine Index: 2 (ref 1.2–4.9)
T3 Uptake Ratio: 26 % (ref 24–39)
T4, Total: 7.7 ug/dL (ref 4.5–12.0)
TSH: 5.54 u[IU]/mL — ABNORMAL HIGH (ref 0.450–4.500)

## 2019-08-31 ENCOUNTER — Telehealth: Payer: Self-pay | Admitting: Family Medicine

## 2019-09-02 DIAGNOSIS — Z1231 Encounter for screening mammogram for malignant neoplasm of breast: Secondary | ICD-10-CM | POA: Diagnosis not present

## 2019-09-02 DIAGNOSIS — J3089 Other allergic rhinitis: Secondary | ICD-10-CM | POA: Diagnosis not present

## 2019-09-10 ENCOUNTER — Encounter: Payer: Self-pay | Admitting: *Deleted

## 2019-09-10 NOTE — Telephone Encounter (Signed)
No call back- this encounter will be closed. Refer to lab notes.

## 2019-09-16 DIAGNOSIS — J3089 Other allergic rhinitis: Secondary | ICD-10-CM | POA: Diagnosis not present

## 2019-09-23 ENCOUNTER — Other Ambulatory Visit: Payer: Self-pay

## 2019-09-23 ENCOUNTER — Encounter: Payer: Self-pay | Admitting: Family Medicine

## 2019-09-23 ENCOUNTER — Ambulatory Visit (INDEPENDENT_AMBULATORY_CARE_PROVIDER_SITE_OTHER): Payer: BC Managed Care – PPO | Admitting: Family Medicine

## 2019-09-23 DIAGNOSIS — J0100 Acute maxillary sinusitis, unspecified: Secondary | ICD-10-CM | POA: Diagnosis not present

## 2019-09-23 MED ORDER — AMOXICILLIN-POT CLAVULANATE 875-125 MG PO TABS: 1.0000 | ORAL_TABLET | Freq: Two times a day (BID) | ORAL | 0 refills | Status: AC

## 2019-09-23 NOTE — Progress Notes (Signed)
Virtual Visit via telephone Note Due to COVID-19 pandemic this visit was conducted virtually. This visit type was conducted due to national recommendations for restrictions regarding the COVID-19 Pandemic (e.g. social distancing, sheltering in place) in an effort to limit this patient's exposure and mitigate transmission in our community. All issues noted in this document were discussed and addressed.  A physical exam was not performed with this format.   I connected with [name] on 09/23/2019 at 1135 by telephone and verified that I am speaking with the correct person using two identifiers. Natalie Valencia is currently located at home and no one is currently with them during visit. The provider, Kari Baars, FNP is located in their office at time of visit.  I discussed the limitations, risks, security and privacy concerns of performing an evaluation and management service by telephone and the availability of in person appointments. I also discussed with the patient that there may be a patient responsible charge related to this service. The patient expressed understanding and agreed to proceed.  Subjective:  Patient ID: Natalie Valencia, female    DOB: 1955-12-26, 63 y.o.   MRN: [id number]  Chief Complaint:  Sinus Problem   HPI: Natalie Valencia is a 63 y.o. female presenting on 09/23/2019 for Sinus Problem   Pt reports ongoing and worsening sinus pressure. States the has pressure under and behind her eyes. States worse with bending over and when supine. She has been using Flonase and Mucinex for over a week without improvement of symptoms.   Sinus Problem This is a new problem. The current episode started 1 to 4 weeks ago. The problem has been gradually worsening since onset. There has been no fever. Her pain is at a severity of 6/10. The pain is moderate. Associated symptoms include chills, congestion, ear pain, headaches, sinus pressure and a sore throat. Pertinent negatives include no  coughing, diaphoresis, hoarse voice, neck pain, shortness of breath, sneezing or swollen glands. Past treatments include oral decongestants and spray decongestants. The treatment provided no relief.     Relevant past medical, surgical, family, and social history reviewed and updated as indicated.  Allergies and medications reviewed and updated.   Past Medical History:  Diagnosis Date  . Allergy   . Asthma   . Back pain   . GERD (gastroesophageal reflux disease)   . Hyperlipidemia    borderline diet controlled  . Hypertension    situational no meds  . Osteopenia   . Thyroid disease     Past Surgical History:  Procedure Laterality Date  . ABDOMINAL HYSTERECTOMY  2001  . COLONOSCOPY    . NASAL SINUS SURGERY    . UPPER GASTROINTESTINAL ENDOSCOPY      Social History   Socioeconomic History  . Marital status: Married    Spouse name: Not on file  . Number of children: 2  . Years of education: Not on file  . Highest education level: Not on file  Occupational History  . Occupation: Tree surgeon  Social Needs  . Financial resource strain: Not on file  . Food insecurity    Worry: Not on file    Inability: Not on file  . Transportation needs    Medical: Not on file    Non-medical: Not on file  Tobacco Use  . Smoking status: Never Smoker  . Smokeless tobacco: Never Used  Substance and Sexual Activity  . Alcohol use: No  . Drug use: No  . Sexual activity: Not on file  Lifestyle  .  Physical activity    Days per week: Not on file    Minutes per session: Not on file  . Stress: Not on file  Relationships  . Social Herbalist on phone: Not on file    Gets together: Not on file    Attends religious service: Not on file    Active member of club or organization: Not on file    Attends meetings of clubs or organizations: Not on file    Relationship status: Not on file  . Intimate partner violence    Fear of current or ex partner: Not on file    Emotionally  abused: Not on file    Physically abused: Not on file    Forced sexual activity: Not on file  Other Topics Concern  . Not on file  Social History Narrative  . Not on file    Outpatient Encounter Medications as of 09/23/2019  Medication Sig  . albuterol (VENTOLIN HFA) 108 (90 Base) MCG/ACT inhaler   . AMBULATORY NON FORMULARY MEDICATION Allergy Shots Take 1 injection every 2 weeks  . amoxicillin-clavulanate (AUGMENTIN) 875-125 MG tablet Take 1 tablet by mouth 2 (two) times daily for 10 days.  Marland Kitchen aspirin EC 81 MG tablet Take by mouth.  . Biotin 2500 MCG CAPS Take by mouth.  . budesonide-formoterol (SYMBICORT) 160-4.5 MCG/ACT inhaler Inhale 2 puffs into the lungs as needed.  . Calcium Carbonate-Vit D-Min (CALCIUM 1200 PO) Take 1 tablet by mouth daily.  . cetirizine (ZYRTEC ALLERGY) 10 MG tablet Take 1 tablet (10 mg total) by mouth daily.  . cholecalciferol (VITAMIN D) 1000 units tablet Take 1,000 Units by mouth daily.  . fluticasone (FLONASE) 50 MCG/ACT nasal spray Place 2 sprays into both nostrils daily.  Marland Kitchen guaiFENesin (MUCINEX) 600 MG 12 hr tablet Take by mouth 2 (two) times daily.  . Ibuprofen 200 MG CAPS Take 1 capsule by mouth as needed.  . Lactobacillus (PROBIOTIC ACIDOPHILUS PO) Take 1 tablet by mouth daily.  Marland Kitchen levothyroxine (SYNTHROID) 50 MCG tablet Take 1 tablet (50 mcg total) by mouth daily.  Marland Kitchen linaclotide (LINZESS) 145 MCG CAPS capsule Take 1 capsule (145 mcg total) by mouth daily before breakfast. (Patient not taking: Reported on 08/28/2019)  . montelukast (SINGULAIR) 10 MG tablet Take 1 tablet (10 mg total) by mouth at bedtime.  . pantoprazole (PROTONIX) 40 MG tablet TAKE ONE TABLET BY MOUTH 30 MINUTES BEFORE BREAKFAST ON AN EMPTY STOMACH  . polyethylene glycol (MIRALAX / GLYCOLAX) packet Take 17 g by mouth daily.  . vitamin B-12 (CYANOCOBALAMIN) 1000 MCG tablet Take 1,000 mcg by mouth daily.   Facility-Administered Encounter Medications as of 09/23/2019  Medication  . 0.9  %  sodium chloride infusion    No Known Allergies  Review of Systems  Constitutional: Positive for chills. Negative for activity change, appetite change, diaphoresis, fatigue, fever and unexpected weight change.  HENT: Positive for congestion, ear pain, postnasal drip, rhinorrhea, sinus pressure, sinus pain and sore throat. Negative for hoarse voice and sneezing.   Eyes: Negative.   Respiratory: Negative for cough, chest tightness and shortness of breath.   Cardiovascular: Negative for chest pain, palpitations and leg swelling.  Gastrointestinal: Negative for abdominal pain, blood in stool, constipation, diarrhea, nausea and vomiting.  Endocrine: Negative.   Genitourinary: Negative for decreased urine volume, difficulty urinating, dysuria, frequency and urgency.  Musculoskeletal: Negative for arthralgias, myalgias and neck pain.  Skin: Negative.  Negative for rash.  Allergic/Immunologic: Negative.   Neurological: Positive for  headaches. Negative for dizziness, facial asymmetry, weakness and light-headedness.  Hematological: Negative.   Psychiatric/Behavioral: Negative for confusion, hallucinations, sleep disturbance and suicidal ideas.  All other systems reviewed and are negative.        Observations/Objective: No vital signs or physical exam, this was a telephone or virtual health encounter.  Pt alert and oriented, answers all questions appropriately, and able to speak in full sentences.    Assessment and Plan: Junious DresserConnie was seen today for sinus problem.  Diagnoses and all orders for this visit:  Acute non-recurrent maxillary sinusitis Ongoing and worsening symptoms for over 8 days. Continue symptomatic care and initiate below. Report any new or worsening symptoms. Medications as prescribed. Follow up as needed.  -     amoxicillin-clavulanate (AUGMENTIN) 875-125 MG tablet; Take 1 tablet by mouth 2 (two) times daily for 10 days.     Follow Up Instructions: Return if symptoms  worsen or fail to improve.    I discussed the assessment and treatment plan with the patient. The patient was provided an opportunity to ask questions and all were answered. The patient agreed with the plan and demonstrated an understanding of the instructions.   The patient was advised to call back or seek an in-person evaluation if the symptoms worsen or if the condition fails to improve as anticipated.  The above assessment and management plan was discussed with the patient. The patient verbalized understanding of and has agreed to the management plan. Patient is aware to call the clinic if they develop any new symptoms or if symptoms persist or worsen. Patient is aware when to return to the clinic for a follow-up visit. Patient educated on when it is appropriate to go to the emergency department.    I provided 15 minutes of non-face-to-face time during this encounter. The call started at 1135. The call ended at 1150. The other time was used for coordination of care.    Kari BaarsMichelle Rakes, FNP-C [facility] [address] [place], [place] [address] [phone] 09/23/2019

## 2019-09-30 DIAGNOSIS — J4540 Moderate persistent asthma, uncomplicated: Secondary | ICD-10-CM | POA: Diagnosis not present

## 2019-09-30 DIAGNOSIS — J3089 Other allergic rhinitis: Secondary | ICD-10-CM | POA: Diagnosis not present

## 2019-10-12 ENCOUNTER — Other Ambulatory Visit: Payer: Self-pay | Admitting: *Deleted

## 2019-10-12 DIAGNOSIS — K2990 Gastroduodenitis, unspecified, without bleeding: Secondary | ICD-10-CM

## 2019-10-12 DIAGNOSIS — K2970 Gastritis, unspecified, without bleeding: Secondary | ICD-10-CM

## 2019-10-12 MED ORDER — PANTOPRAZOLE SODIUM 40 MG PO TBEC: 40.0000 mg | DELAYED_RELEASE_TABLET | Freq: Every day | ORAL | 2 refills | Status: DC

## 2019-10-12 MED ORDER — LINACLOTIDE 145 MCG PO CAPS: 145.0000 ug | ORAL_CAPSULE | Freq: Every day | ORAL | 5 refills | Status: DC

## 2019-10-14 DIAGNOSIS — J3089 Other allergic rhinitis: Secondary | ICD-10-CM | POA: Diagnosis not present

## 2019-10-28 DIAGNOSIS — J3089 Other allergic rhinitis: Secondary | ICD-10-CM | POA: Diagnosis not present

## 2019-11-18 DIAGNOSIS — J3089 Other allergic rhinitis: Secondary | ICD-10-CM | POA: Diagnosis not present

## 2019-11-27 ENCOUNTER — Other Ambulatory Visit: Payer: Self-pay

## 2019-11-27 ENCOUNTER — Ambulatory Visit (INDEPENDENT_AMBULATORY_CARE_PROVIDER_SITE_OTHER): Payer: BC Managed Care – PPO

## 2019-11-27 DIAGNOSIS — M8588 Other specified disorders of bone density and structure, other site: Secondary | ICD-10-CM | POA: Diagnosis not present

## 2019-11-27 DIAGNOSIS — Z13820 Encounter for screening for osteoporosis: Secondary | ICD-10-CM | POA: Diagnosis not present

## 2019-11-27 DIAGNOSIS — M810 Age-related osteoporosis without current pathological fracture: Secondary | ICD-10-CM | POA: Diagnosis not present

## 2019-12-02 ENCOUNTER — Other Ambulatory Visit: Payer: Self-pay

## 2019-12-03 ENCOUNTER — Ambulatory Visit: Payer: BC Managed Care – PPO | Admitting: Family Medicine

## 2019-12-03 ENCOUNTER — Encounter: Payer: Self-pay | Admitting: Family Medicine

## 2019-12-03 VITALS — BP 132/83 | HR 77 | Temp 97.3°F | Resp 18 | Ht 68.0 in | Wt 166.0 lb

## 2019-12-03 DIAGNOSIS — J3089 Other allergic rhinitis: Secondary | ICD-10-CM | POA: Diagnosis not present

## 2019-12-03 DIAGNOSIS — M810 Age-related osteoporosis without current pathological fracture: Secondary | ICD-10-CM | POA: Diagnosis not present

## 2019-12-03 DIAGNOSIS — T148XXA Other injury of unspecified body region, initial encounter: Secondary | ICD-10-CM

## 2019-12-03 DIAGNOSIS — S29001A Unspecified injury of muscle and tendon of front wall of thorax, initial encounter: Secondary | ICD-10-CM | POA: Diagnosis not present

## 2019-12-03 MED ORDER — ALENDRONATE SODIUM 70 MG PO TABS: 70.0000 mg | ORAL_TABLET | ORAL | 11 refills | Status: DC

## 2019-12-03 NOTE — Patient Instructions (Signed)
Osteoporosis Your recent bone density scan demonstrated that you have osteoporosis.  Today, I provided you a handout reviewing calcium and vitamin D and ways to incorporate this in your diet.  You need to take a calcium and vitamin D supplement daily. I would also like to start you on Prolia to prevent further loss of bone density. This is an injection that is given every 6 months at the office. Please let us know if this is something you would like to start. Strength training is just as important in maintaining bone health.  A copy of home exercises has been provided to you. You need to repeat your bone density in 2 years.    Osteoporosis is the thinning and loss of density in the bones. Osteoporosis makes the bones more brittle, fragile, and likely to break (fracture). Over time, osteoporosis can cause the bones to become so weak that they fracture after a simple fall. The bones most likely to fracture are the bones in the hip, wrist, and spine. What are the causes? The exact cause is not known. What increases the risk? Anyone can develop osteoporosis. You may be at greater risk if you have a family history of the condition or have poor nutrition. You may also have a higher risk if you are:  Female.  33 years old or older.  A smoker.  Not physically active.  White or Asian.  Slender.  What are the signs or symptoms? A fracture might be the first sign of the disease, especially if it results from a fall or injury that would not usually cause a bone to break. Other signs and symptoms include:  Low back and neck pain.  Stooped posture.  Height loss.  How is this diagnosed? To make a diagnosis, your health care provider may:  Take a medical history.  Perform a physical exam.  Order tests, such as: ? A bone mineral density test. ? A dual-energy X-ray absorptiometry test.  How is this treated? The goal of osteoporosis treatment is to strengthen your bones to reduce your risk  of a fracture. Treatment may involve:  Making lifestyle changes, such as: ? Eating a diet rich in calcium. ? Doing weight-bearing and muscle-strengthening exercises. ? Stopping tobacco use. ? Limiting alcohol intake.  Taking medicine to slow the process of bone loss or to increase bone density.  Monitoring your levels of calcium and vitamin D.  Follow these instructions at home:  Include calcium and vitamin D in your diet. Calcium is important for bone health, and vitamin D helps the body absorb calcium. Os-Cal is a great over the counter supplement to take daily. It has calcium and Vitamin D.   Perform weight-bearing and muscle-strengthening exercises as directed by your health care provider. Exercises listed below.   Do not use any tobacco products, including cigarettes, chewing tobacco, and electronic cigarettes. If you need help quitting, ask your health care provider.  Limit your alcohol intake.  Take medicines only as directed by your health care provider.  Keep all follow-up visits as directed by your health care provider. This is important.  Take precautions at home to lower your risk of falling, such as: ? Keeping rooms well lit and clutter free. ? Installing safety rails on stairs. ? Using rubber mats in the bathroom and other areas that are often wet or slippery.  Get help right away if: You fall or injure yourself. This information is not intended to replace advice given to you by your health  care provider. Make sure you discuss any questions you have with your health care provider. Document Released: 07/25/2005 Document Revised: 03/19/2016 Document Reviewed: 03/25/2014 Elsevier Interactive Patient Education  2018 [employer].  Exercise for [employer]  Exercise is important to build and maintain strong bones / bone density.  There are 2 types of exercises that are important to building and maintaining strong bones:  Weight- bearing and  muscle-stregthening.  Weight-bearing Exercises  These exercises include activities that make you move against gravity while staying upright. Weight-bearing exercises can be high-impact or low-impact.  High-impact weight-bearing exercises help build bones and keep them strong. If you have broken a bone due to osteoporosis or are at risk of breaking a bone, you may need to avoid high-impact exercises. If you're not sure, you should check with your healthcare provider.  Examples of high-impact weight-bearing exercises are: . Dancing  . Doing high-impact aerobics  . Hiking  . Jogging/running  . Jumping Rope  . Stair climbing  . Tennis  Low-impact weight-bearing exercises can also help keep bones strong and are a safe alternative if you cannot do high-impact exercises.   Examples of low-impact weight-bearing exercises are: . Using elliptical training machines  . Doing low-impact aerobics  . Using stair-step machines  . Fast walking on a treadmill or outside   Muscle-Strengthening Exercises These exercises include activities where you move your body, a weight or some other resistance against gravity. They are also known as resistance exercises and include: . Lifting weights  . Using elastic exercise bands  . Using weight machines  . Lifting your own body weight  . Functional movements, such as standing and rising up on your toes  Yoga and Pilates can also improve strength, balance and flexibility. However, certain positions may not be safe for people with osteoporosis or those at increased risk of broken bones. For example, exercises that have you bend forward may increase the chance of breaking a bone in the spine.   Non-Impact Exercises There are other types of exercises that can help prevent falls.  Non-impact exercises can help you to improve balance, posture and how well you move in everyday activities. Some of these exercises include: . Balance exercises that strengthen your  legs and test your balance, such as Tai Chi, can decrease your risk of falls.  . Posture exercises that improve your posture and reduce rounded or "sloping" shoulders can help you decrease the chance of breaking a bone, especially in the spine.  . Functional exercises that improve how well you move can help you with everyday activities and decrease your chance of falling and breaking a bone. For example, if you have trouble getting up from a chair or climbing stairs, you should do these activities as exercises.   A physical therapist can teach you balance, posture and functional exercises. He/she can also help you learn which exercises are safe and appropriate for you.  [facility] has a physical therapy office in [place] in front of our office and referrals can be made for assessments and treatment as needed and strength and balance training.  If you would like to have an assessment with [facility] and our physical therapy team please let a nurse or provider know.  For more information go to the [facility] website: [email or web address]   . Click the striped box in the right upper corner.   . Then click through patient info in the left lower hand corner to out more about osteoporosis and osteopenia  and how you can prevent fractures.      Calcium & Vitamin D: The Facts  Why is calcium and vitamin D consumption important? Calcium: . Most Americans do not consume adequate amounts of calcium! Calcium is required for proper muscle function, nerve communication, bone support, and many other functions in the body.  . The body uses bones as a source of calcium. Bones 'remodel' themselves continuously - the body constantly breaks bone down to release calcium and rebuilds bones by replacing calcium in the bone later.  . As we get older, the rate of bone breakdown occurs faster than bone rebuilding which could lead to osteopenia, osteoporosis, and possible fractures.   Vitamin  D: . People naturally make vitamin D in the body when sunlight hits the skin and triggers a process that leads to vitamin D production. This natural vitamin D production requires about 10-15 minutes of sun exposure on the hands, arms, and face at least 2-3 times per week. However, due to decreased sun exposure and the use of sunscreen, most people will need to get additional vitamin D from foods or supplements. Your doctor can measure your body's vitamin D level through a simple blood test to determine your daily vitamin D needs.  . Vitamin D is used to help the body absorb calcium, maintain bone health, help the immune system, and reduce inflammation. It also plays a role in muscle performance, balance and risk of falling.  . Vitamin D deficiency can lead to osteomalacia or softening of the bones, bone pain, and muscle weakness.   The recommended daily allowance of Calcium and Vitamin D varies for different age groups. Age group Calcium (mg) Vitamin D (IU)  Females and Males: Age 19-50 1000 mg 600 IU  Females: Age 51- 70 1200 mg 600 IU  Males: Age 51-70 1000 mg 600 IU  Females and Males: Age 71+ 1200 mg 800 IU  Pregnant/lactating Females age 19-50 1000 mg 600 IU   How much Calcium do you get in your diet? Calcium Intake # of servings per day  Total calcium (mg)  Skim milk, 2% milk (1 cup) _________ x 300 mg   Yogurt (1 small container) _________ x 200 mg   Cheese (1oz) _________ x 200 mg   Cottage Cheese (1 cup)             ________ x 150 mg   Almond milk (1 cup) _________ x 450 mg   Fortified Orange Juice (1 cup) _________ x 300 mg   Broccoli or spinach ( 1 cup) _________ x 100 mg   Salmon (3 oz) _________ x 150 mg    Almonds (1/4 cup) _______ x 90 mg      How do we get Calcium and Vitamin D in our diet? Calcium: . Obtaining calcium from the diet is the most preferred way to reach the recommended daily goal. If this goal is not reached through diet, calcium supplements are available.   . Calcium is found in many foods including: dairy products, dark leafy vegetables (like broccoli, kale, and spinach), fish, and fortified products like juices and cereals.  . The food label will have a %DV (percent daily value) listed showing the amount of calcium per serving. To determine the total mg per serving, simply replace the % with zero (0).  For example, Almond Breeze almond milk contains 45% DV of calcium or 450mg per 1 cup.  . You can increase the amount of calcium in your diet by using   more calcium products in your daily meals. Use yogurt and fruit to make smoothies or use yogurt to top baked potatoes or make whipped potatoes. Sprinkle low fat cheese onto salads or into egg white omelets. You can even add non-fat dry milk powder (357m calcium per 1/3 cup) to hot cereals, meat loaf, soups, or potatoes.  . Calcium supplements come in many forms including tablets, chewables, and gummies. Be sure to read the label to determine the correct number of tablets per serving and whether or not to take the supplement with food.  . Calcium carbonate products (Oscal, Caltrate, and Viactiv) are generally better absorbed when taken with food while calcium citrate products like Citracal can be taken with or without food.  . The body can only absorb about 600 mg of calcium at one time. It is recommended to take calcium supplements in small amounts several times per day.  However, taking it all at once is better than not taking it at all. . Increasing your intake of calcium is essential for bone health, but may also lead to some side effects like constipation, increased gas, bloating or abdominal cramping. To help reduce these side effects, start with 1 tablet per day and slowly increase your intake of the supplement to the recommended doses. It is also recommended that you drink plenty of water each day. Vitamin D: . Very few foods naturally contain vitamin D. However, it is found in saltwater fish (like  tuna, salmon and mackerel), beef liver, egg yolks, cheese and vitamin D fortified foods (like yogurt, cereals, orange juice and milk) . The amount of vitamin D in each food or product is listed as %DV on the product label. To determine the total amount of vitamin D per serving, drop the % sign and multiply the number by 4. For example, 1 cup of Almond Breeze almond milk contains 25% DV vitamin D or 100 IU per serving (25 x 4 =100). . Vitamin D is also found in multivitamins and supplements and may be listed as ergocalciferol (vitamin D2) or cholecalciferol (vitamin D3). Each of these forms of vitamin D are equivalent and the daily recommended intake will vary based on your age and the vitamin D levels in your body. Follow your doctor's recommendation for vitamin D intake.

## 2019-12-03 NOTE — Progress Notes (Signed)
Subjective:  Patient ID: Natalie Valencia, female    DOB: 1956-08-09, 64 y.o.   MRN: [id number]  Patient Care Team: Baruch Gouty, FNP as PCP - General (Family Medicine)   Chief Complaint:  Knot above sternum (spot is no longer there but site still sore)   HPI: Natalie Valencia is a 64 y.o. female presenting on 12/03/2019 for Knot above sternum (spot is no longer there but site still sore)   1. Hematoma Pt presents for evaluation of a knot under her sternum. Pt states she was having her DEXA scan and noticed the area of swelling. States the area was slightly tender to touch. States the know is gone now but the area is still tender. No known injury. No other associated symptoms.   2. Age-related osteoporosis without current pathological fracture DEXA scan completed on 11/27/2019 with a T-Score of -2.7 indicting osteoporosis. Pt is currently on calcium repletion therapy. No current or recent fractures.      Relevant past medical, surgical, family, and social history reviewed and updated as indicated.  Allergies and medications reviewed and updated. Date reviewed: Chart in Epic.   Past Medical History:  Diagnosis Date  . Allergy   . Asthma   . Back pain   . GERD (gastroesophageal reflux disease)   . Hyperlipidemia    borderline diet controlled  . Hypertension    situational no meds  . Osteopenia   . Thyroid disease     Past Surgical History:  Procedure Laterality Date  . ABDOMINAL HYSTERECTOMY  2001  . COLONOSCOPY    . NASAL SINUS SURGERY    . UPPER GASTROINTESTINAL ENDOSCOPY      Social History   Socioeconomic History  . Marital status: Married    Spouse name: Not on file  . Number of children: 2  . Years of education: Not on file  . Highest education level: Not on file  Occupational History  . Occupation: assembler  Tobacco Use  . Smoking status: Never Smoker  . Smokeless tobacco: Never Used  Substance and Sexual Activity  . Alcohol use: No  . Drug use: No   . Sexual activity: Not on file  Other Topics Concern  . Not on file  Social History Narrative  . Not on file   Social Determinants of Health   Financial Resource Strain:   . Difficulty of Paying Living Expenses: Not on file  Food Insecurity:   . Worried About Charity fundraiser in the Last Year: Not on file  . Ran Out of Food in the Last Year: Not on file  Transportation Needs:   . Lack of Transportation (Medical): Not on file  . Lack of Transportation (Non-Medical): Not on file  Physical Activity:   . Days of Exercise per Week: Not on file  . Minutes of Exercise per Session: Not on file  Stress:   . Feeling of Stress : Not on file  Social Connections:   . Frequency of Communication with Friends and Family: Not on file  . Frequency of Social Gatherings with Friends and Family: Not on file  . Attends Religious Services: Not on file  . Active Member of Clubs or Organizations: Not on file  . Attends Archivist Meetings: Not on file  . Marital Status: Not on file  Intimate Partner Violence:   . Fear of Current or Ex-Partner: Not on file  . Emotionally Abused: Not on file  . Physically Abused: Not on file  .  Sexually Abused: Not on file    Outpatient Encounter Medications as of 12/03/2019  Medication Sig  . albuterol (VENTOLIN HFA) 108 (90 Base) MCG/ACT inhaler   . AMBULATORY NON FORMULARY MEDICATION Allergy Shots Take 1 injection every 2 weeks  . aspirin EC 81 MG tablet Take by mouth.  . Biotin 2500 MCG CAPS Take by mouth.  . budesonide-formoterol (SYMBICORT) 160-4.5 MCG/ACT inhaler Inhale 2 puffs into the lungs as needed.  . Calcium Carbonate-Vit D-Min (CALCIUM 1200 PO) Take 1 tablet by mouth daily.  . cetirizine (ZYRTEC ALLERGY) 10 MG tablet Take 1 tablet (10 mg total) by mouth daily.  . cholecalciferol (VITAMIN D) 1000 units tablet Take 1,000 Units by mouth daily.  . fluticasone (FLONASE) 50 MCG/ACT nasal spray Place 2 sprays into both nostrils daily.  Marland Kitchen  guaiFENesin (MUCINEX) 600 MG 12 hr tablet Take by mouth 2 (two) times daily.  . Ibuprofen 200 MG CAPS Take 1 capsule by mouth as needed.  . Lactobacillus (PROBIOTIC ACIDOPHILUS PO) Take 1 tablet by mouth daily.  Marland Kitchen levothyroxine (SYNTHROID) 50 MCG tablet Take 1 tablet (50 mcg total) by mouth daily.  Marland Kitchen linaclotide (LINZESS) 145 MCG CAPS capsule Take 1 capsule (145 mcg total) by mouth daily before breakfast.  . pantoprazole (PROTONIX) 40 MG tablet Take 1 tablet (40 mg total) by mouth daily.  . polyethylene glycol (MIRALAX / GLYCOLAX) packet Take 17 g by mouth daily as needed.   Marland Kitchen alendronate (FOSAMAX) 70 MG tablet Take 1 tablet (70 mg total) by mouth every 7 (seven) days. Take with a full glass of water on an empty stomach.  . fexofenadine (ALLEGRA) 180 MG tablet Take 180 mg by mouth daily.  . vitamin B-12 (CYANOCOBALAMIN) 1000 MCG tablet Take 1,000 mcg by mouth daily.  . [DISCONTINUED] montelukast (SINGULAIR) 10 MG tablet Take 1 tablet (10 mg total) by mouth at bedtime.  . [DISCONTINUED] 0.9 %  sodium chloride infusion    No facility-administered encounter medications on file as of 12/03/2019.    No Known Allergies  Review of Systems  Constitutional: Negative for activity change, appetite change, chills, diaphoresis, fatigue, fever and unexpected weight change.  HENT: Negative.   Eyes: Negative.  Negative for photophobia and visual disturbance.  Respiratory: Negative for cough, chest tightness and shortness of breath.   Cardiovascular: Negative for chest pain, palpitations and leg swelling.  Gastrointestinal: Negative for abdominal pain, blood in stool, constipation, diarrhea, nausea and vomiting.  Endocrine: Negative.   Genitourinary: Negative for decreased urine volume, difficulty urinating, dysuria, frequency and urgency.  Musculoskeletal: Negative for arthralgias and myalgias.  Skin: Positive for color change.  Allergic/Immunologic: Negative.   Neurological: Negative for dizziness,  tremors, seizures, syncope, facial asymmetry, speech difficulty, weakness, light-headedness, numbness and headaches.  Hematological: Negative.   Psychiatric/Behavioral: Negative for confusion, hallucinations, sleep disturbance and suicidal ideas.  All other systems reviewed and are negative.       Objective:  BP 132/83   Pulse 77   Temp (!) 97.3 F (36.3 C)   Resp 18   Ht 5' 8"  (1.727 m)   Wt 166 lb (75.3 kg)   SpO2 97%   BMI 25.24 kg/m    Wt Readings from Last 3 Encounters:  12/03/19 166 lb (75.3 kg)  08/28/19 163 lb (73.9 kg)  06/20/18 169 lb (76.7 kg)    Physical Exam Vitals and nursing note reviewed.  Constitutional:      General: She is not in acute distress.    Appearance: Normal appearance. She  is well-developed and well-groomed. She is not ill-appearing, toxic-appearing or diaphoretic.  HENT:     Head: Normocephalic and atraumatic.     Jaw: There is normal jaw occlusion.     Right Ear: Hearing normal.     Left Ear: Hearing normal.     Nose: Nose normal.     Mouth/Throat:     Lips: Pink.     Mouth: Mucous membranes are moist.     Pharynx: Oropharynx is clear. Uvula midline.  Eyes:     General: Lids are normal.     Extraocular Movements: Extraocular movements intact.     Conjunctiva/sclera: Conjunctivae normal.     Pupils: Pupils are equal, round, and reactive to light.  Neck:     Thyroid: No thyroid mass, thyromegaly or thyroid tenderness.     Vascular: No carotid bruit or JVD.     Trachea: Trachea and phonation normal.  Cardiovascular:     Rate and Rhythm: Normal rate and regular rhythm.     Chest Wall: PMI is not displaced.     Pulses: Normal pulses.     Heart sounds: Normal heart sounds. No murmur. No friction rub. No gallop.   Pulmonary:     Effort: Pulmonary effort is normal. No respiratory distress.     Breath sounds: Normal breath sounds. No wheezing.  Abdominal:     General: Abdomen is flat. Bowel sounds are normal. There is no distension or  abdominal bruit.     Palpations: Abdomen is soft. There is no shifting dullness, fluid wave, hepatomegaly, splenomegaly, mass or pulsatile mass.     Tenderness: There is no abdominal tenderness. There is no right CVA tenderness or left CVA tenderness.     Hernia: No hernia is present.    Musculoskeletal:        General: Normal range of motion.     Cervical back: Normal range of motion and neck supple.     Right lower leg: No edema.     Left lower leg: No edema.  Lymphadenopathy:     Cervical: No cervical adenopathy.  Skin:    General: Skin is warm and dry.     Capillary Refill: Capillary refill takes less than 2 seconds.     Coloration: Skin is not cyanotic, jaundiced or pale.     Findings: No rash.  Neurological:     General: No focal deficit present.     Mental Status: She is alert and oriented to person, place, and time.     Cranial Nerves: Cranial nerves are intact. No cranial nerve deficit.     Sensory: Sensation is intact. No sensory deficit.     Motor: Motor function is intact. No weakness.     Coordination: Coordination is intact. Coordination normal.     Gait: Gait is intact. Gait normal.     Deep Tendon Reflexes: Reflexes are normal and symmetric. Reflexes normal.  Psychiatric:        Attention and Perception: Attention and perception normal.        Mood and Affect: Mood and affect normal.        Speech: Speech normal.        Behavior: Behavior normal. Behavior is cooperative.        Thought Content: Thought content normal.        Cognition and Memory: Cognition and memory normal.        Judgment: Judgment normal.     Results for orders placed or performed in visit on  08/28/19  CMP14+EGFR  Result Value Ref Range   Glucose 87 65 - 99 mg/dL   BUN 13 8 - 27 mg/dL   Creatinine, Ser 0.73 0.57 - 1.00 mg/dL   GFR calc non Af Amer 88 >59 mL/min/1.73   GFR calc Af Amer 101 >59 mL/min/1.73   BUN/Creatinine Ratio 18 12 - 28   Sodium 140 134 - 144 mmol/L   Potassium 4.1  3.5 - 5.2 mmol/L   Chloride 103 96 - 106 mmol/L   CO2 25 20 - 29 mmol/L   Calcium 9.8 8.7 - 10.3 mg/dL   Total Protein 6.9 6.0 - 8.5 g/dL   Albumin 4.3 3.8 - 4.8 g/dL   Globulin, Total 2.6 1.5 - 4.5 g/dL   Albumin/Globulin Ratio 1.7 1.2 - 2.2   Bilirubin Total 0.2 0.0 - 1.2 mg/dL   Alkaline Phosphatase 84 39 - 117 IU/L   AST 21 0 - 40 IU/L   ALT 21 0 - 32 IU/L  CBC with Differential/Platelet  Result Value Ref Range   WBC 8.2 3.4 - 10.8 x10E3/uL   RBC 4.60 3.77 - 5.28 x10E6/uL   Hemoglobin 13.6 11.1 - 15.9 g/dL   Hematocrit 40.5 34.0 - 46.6 %   MCV 88 79 - 97 fL   MCH 29.6 26.6 - 33.0 pg   MCHC 33.6 31.5 - 35.7 g/dL   RDW 12.4 11.7 - 15.4 %   Platelets 319 150 - 450 x10E3/uL   Neutrophils 35 Not Estab. %   Lymphs 30 Not Estab. %   Monocytes 7 Not Estab. %   Eos 27 Not Estab. %   Basos 1 Not Estab. %   Neutrophils Absolute 2.8 1.4 - 7.0 x10E3/uL   Lymphocytes Absolute 2.4 0.7 - 3.1 x10E3/uL   Monocytes Absolute 0.6 0.1 - 0.9 x10E3/uL   EOS (ABSOLUTE) 2.2 (H) 0.0 - 0.4 x10E3/uL   Basophils Absolute 0.1 0.0 - 0.2 x10E3/uL   Immature Granulocytes 0 Not Estab. %   Immature Grans (Abs) 0.0 0.0 - 0.1 x10E3/uL   Hematology Comments: Note:   Lipid panel  Result Value Ref Range   Cholesterol, Total 225 (H) 100 - 199 mg/dL   Triglycerides 139 0 - 149 mg/dL   HDL 70 >39 mg/dL   VLDL Cholesterol Cal 24 5 - 40 mg/dL   LDL Chol Calc (NIH) 131 (H) 0 - 99 mg/dL   Chol/HDL Ratio 3.2 0.0 - 4.4 ratio  Thyroid Panel With TSH  Result Value Ref Range   TSH 5.540 (H) 0.450 - 4.500 uIU/mL   T4, Total 7.7 4.5 - 12.0 ug/dL   T3 Uptake Ratio 26 24 - 39 %   Free Thyroxine Index 2.0 1.2 - 4.9       Pertinent labs & imaging results that were available during my care of the patient were reviewed by me and considered in my medical decision making.  Assessment & Plan:  Natalie Valencia was seen today for knot above sternum.  Diagnoses and all orders for this visit:  Hematoma Resolving hematoma to  epigastric area. Pt aware to report any new, worsening, or persistent symptoms.   Age-related osteoporosis without current pathological fracture Continue calcium repletion therapy and initiate Fosamax once weekly. Discussed proper dosing of medications and signs of OJN.  -     alendronate (FOSAMAX) 70 MG tablet; Take 1 tablet (70 mg total) by mouth every 7 (seven) days. Take with a full glass of water on an empty stomach.     Continue  all other maintenance medications.  Follow up plan: Return if symptoms worsen or fail to improve.  Continue healthy lifestyle choices, including diet (rich in fruits, vegetables, and lean proteins, and low in salt and simple carbohydrates) and exercise (at least 30 minutes of moderate physical activity daily).  Educational handout given for osteoporosis  The above assessment and management plan was discussed with the patient. The patient verbalized understanding of and has agreed to the management plan. Patient is aware to call the clinic if they develop any new symptoms or if symptoms persist or worsen. Patient is aware when to return to the clinic for a follow-up visit. Patient educated on when it is appropriate to go to the emergency department.   Monia Pouch, FNP-C [facility] [phone]

## 2019-12-09 DIAGNOSIS — J3089 Other allergic rhinitis: Secondary | ICD-10-CM | POA: Diagnosis not present

## 2020-01-20 DIAGNOSIS — J3089 Other allergic rhinitis: Secondary | ICD-10-CM | POA: Diagnosis not present

## 2020-02-03 DIAGNOSIS — J3089 Other allergic rhinitis: Secondary | ICD-10-CM | POA: Diagnosis not present

## 2020-03-03 DIAGNOSIS — J3089 Other allergic rhinitis: Secondary | ICD-10-CM | POA: Diagnosis not present

## 2020-03-17 DIAGNOSIS — J3089 Other allergic rhinitis: Secondary | ICD-10-CM | POA: Diagnosis not present

## 2020-04-04 DIAGNOSIS — J3089 Other allergic rhinitis: Secondary | ICD-10-CM | POA: Diagnosis not present

## 2020-04-18 DIAGNOSIS — J3089 Other allergic rhinitis: Secondary | ICD-10-CM | POA: Diagnosis not present

## 2020-05-03 DIAGNOSIS — J3089 Other allergic rhinitis: Secondary | ICD-10-CM | POA: Diagnosis not present

## 2020-05-17 DIAGNOSIS — J3089 Other allergic rhinitis: Secondary | ICD-10-CM | POA: Diagnosis not present

## 2020-05-31 DIAGNOSIS — Z20828 Contact with and (suspected) exposure to other viral communicable diseases: Secondary | ICD-10-CM | POA: Diagnosis not present

## 2020-06-01 ENCOUNTER — Ambulatory Visit: Payer: BC Managed Care – PPO | Admitting: Family Medicine

## 2020-06-01 DIAGNOSIS — R0989 Other specified symptoms and signs involving the circulatory and respiratory systems: Secondary | ICD-10-CM | POA: Diagnosis not present

## 2020-06-01 DIAGNOSIS — R05 Cough: Secondary | ICD-10-CM | POA: Diagnosis not present

## 2020-06-01 DIAGNOSIS — Z20828 Contact with and (suspected) exposure to other viral communicable diseases: Secondary | ICD-10-CM | POA: Diagnosis not present

## 2020-06-01 DIAGNOSIS — R0602 Shortness of breath: Secondary | ICD-10-CM | POA: Diagnosis not present

## 2020-06-01 DIAGNOSIS — R062 Wheezing: Secondary | ICD-10-CM | POA: Diagnosis not present

## 2020-06-01 NOTE — Progress Notes (Signed)
Called patient and left a voicemail, no answer.  Instructed her to give [place] call back and reschedule if she is still not feeling well.

## 2020-07-01 DIAGNOSIS — J029 Acute pharyngitis, unspecified: Secondary | ICD-10-CM | POA: Diagnosis not present

## 2020-07-01 DIAGNOSIS — Z6825 Body mass index (BMI) 25.0-25.9, adult: Secondary | ICD-10-CM | POA: Diagnosis not present

## 2020-07-01 DIAGNOSIS — J020 Streptococcal pharyngitis: Secondary | ICD-10-CM | POA: Diagnosis not present

## 2020-07-06 DIAGNOSIS — Z6825 Body mass index (BMI) 25.0-25.9, adult: Secondary | ICD-10-CM | POA: Diagnosis not present

## 2020-07-06 DIAGNOSIS — J329 Chronic sinusitis, unspecified: Secondary | ICD-10-CM | POA: Diagnosis not present

## 2020-07-12 DIAGNOSIS — J3089 Other allergic rhinitis: Secondary | ICD-10-CM | POA: Diagnosis not present

## 2020-07-20 ENCOUNTER — Ambulatory Visit: Payer: BC Managed Care – PPO | Admitting: Family Medicine

## 2020-07-20 ENCOUNTER — Encounter: Payer: Self-pay | Admitting: Family Medicine

## 2020-07-20 ENCOUNTER — Other Ambulatory Visit: Payer: Self-pay

## 2020-07-20 VITALS — BP 125/70 | HR 72 | Temp 97.4°F | Resp 20 | Ht 68.0 in | Wt 168.0 lb

## 2020-07-20 DIAGNOSIS — K2990 Gastroduodenitis, unspecified, without bleeding: Secondary | ICD-10-CM

## 2020-07-20 DIAGNOSIS — E785 Hyperlipidemia, unspecified: Secondary | ICD-10-CM | POA: Diagnosis not present

## 2020-07-20 DIAGNOSIS — J45909 Unspecified asthma, uncomplicated: Secondary | ICD-10-CM

## 2020-07-20 DIAGNOSIS — E039 Hypothyroidism, unspecified: Secondary | ICD-10-CM

## 2020-07-20 DIAGNOSIS — M810 Age-related osteoporosis without current pathological fracture: Secondary | ICD-10-CM | POA: Diagnosis not present

## 2020-07-20 DIAGNOSIS — I10 Essential (primary) hypertension: Secondary | ICD-10-CM

## 2020-07-20 DIAGNOSIS — K2970 Gastritis, unspecified, without bleeding: Secondary | ICD-10-CM

## 2020-07-20 MED ORDER — ALENDRONATE SODIUM 70 MG PO TABS: 70.0000 mg | ORAL_TABLET | ORAL | 11 refills | Status: DC

## 2020-07-20 MED ORDER — PANTOPRAZOLE SODIUM 40 MG PO TBEC: 40.0000 mg | DELAYED_RELEASE_TABLET | Freq: Every day | ORAL | 2 refills | Status: DC

## 2020-07-20 MED ORDER — CETIRIZINE HCL 10 MG PO TABS: 10.0000 mg | ORAL_TABLET | Freq: Every day | ORAL | 3 refills | Status: DC

## 2020-07-20 MED ORDER — BUDESONIDE-FORMOTEROL FUMARATE 160-4.5 MCG/ACT IN AERO: 2.0000 | INHALATION_SPRAY | Freq: Two times a day (BID) | RESPIRATORY_TRACT | 3 refills | Status: DC

## 2020-07-20 NOTE — Progress Notes (Signed)
Subjective:  Patient ID: Natalie Valencia, female    DOB: 1956-02-21  Age: 64 y.o. MRN: [id number]    CC: Medication Refill   HPI Natalie Valencia presents for follow-up on patient presents for follow-up on  thyroid. The patient has a history of hypothyroidism for many years. It has been stable recently. Pt. denies any loss of hair, heat or cold intolerance. Energy level has been adequate to good.  She does feel tired a lot.  Patient denies constipation and diarrhea. No myxedema. Medication is as noted below.  Feel particularly different  Patient in for follow-up of GERD. Currently asymptomatic taking  PPI daily. There is no chest pain or heartburn. No hematemesis and no melena. No dysphagia or choking. Onset is remote. Progression is stable. Complicating factors, none.    Follow-up of hypertension. Patient has no history of headache chest pain or shortness of breath or recent cough. Patient also denies symptoms of TIA such as numbness weakness lateralizing. Patient checks  blood pressure at home and has not had any elevated readings recently. Patient denies side effects from his medication. States taking it regularly.   Patient is followed for asthma as well.  He is currently out of her Symbicort.  When she has it she usually takes just 2 puffs in the morning that seems to hold her asthma adequately.  She does also take allergy immunotherapy allergist is in [place]  Patient has not taken Covid shots.  She does have some questions open to consider in the future.  Depression screen [facility] 2/9 07/20/2020 12/03/2019 08/28/2019  Decreased Interest 0 0 0  Down, Depressed, Hopeless 0 0 0  PHQ - 2 Score 0 0 0  Altered sleeping - - -  Tired, decreased energy - - -  Change in appetite - - -  Feeling bad or failure about yourself  - - -  Trouble concentrating - - -  Moving slowly or fidgety/restless - - -  Suicidal thoughts - - -  PHQ-9 Score - - -    History Natalie Valencia has a past medical history  of Allergy, Asthma, Back pain, GERD (gastroesophageal reflux disease), Hyperlipidemia, Hypertension, Osteopenia, and Thyroid disease.   She has a past surgical history that includes Abdominal hysterectomy (2001); Nasal sinus surgery; Colonoscopy; and Upper gastrointestinal endoscopy.   Her family history includes Alcoholism in her maternal grandfather; Cancer in her mother; Diabetes in her father; Stroke in her paternal grandmother.She reports that she has never smoked. She has never used smokeless tobacco. She reports that she does not drink alcohol and does not use drugs.    ROS Review of Systems  Constitutional: Positive for fatigue. Negative for activity change.  HENT: Negative.   Respiratory: Negative for shortness of breath.   Cardiovascular: Negative for chest pain.  Gastrointestinal: Negative for abdominal pain.  Musculoskeletal: Negative for arthralgias.  Neurological: Negative.     Objective:  BP 125/70   Pulse 72   Temp (!) 97.4 F (36.3 C) (Temporal)   Resp 20   Ht 5' 8"  (1.727 m)   Wt 168 lb (76.2 kg)   SpO2 97%   BMI 25.54 kg/m   BP Readings from Last 3 Encounters:  07/20/20 125/70  12/03/19 132/83  08/28/19 114/64    Wt Readings from Last 3 Encounters:  07/20/20 168 lb (76.2 kg)  12/03/19 166 lb (75.3 kg)  08/28/19 163 lb (73.9 kg)     Physical Exam Constitutional:      General: She is not in  acute distress.    Appearance: She is well-developed.  HENT:     Head: Normocephalic and atraumatic.  Eyes:     Conjunctiva/sclera: Conjunctivae normal.     Pupils: Pupils are equal, round, and reactive to light.  Neck:     Thyroid: No thyromegaly.  Cardiovascular:     Rate and Rhythm: Normal rate and regular rhythm.     Heart sounds: Normal heart sounds. No murmur heard.   Pulmonary:     Effort: Pulmonary effort is normal. No respiratory distress.     Breath sounds: Normal breath sounds. No wheezing or rales.  Abdominal:     General: Bowel sounds are  normal. There is no distension.     Palpations: Abdomen is soft.     Tenderness: There is no abdominal tenderness.  Musculoskeletal:        General: Normal range of motion.     Cervical back: Normal range of motion and neck supple.  Lymphadenopathy:     Cervical: No cervical adenopathy.  Skin:    General: Skin is warm and dry.  Neurological:     Mental Status: She is alert and oriented to person, place, and time.  Psychiatric:        Behavior: Behavior normal.        Thought Content: Thought content normal.        Judgment: Judgment normal.       Assessment & Plan:   Natalie Valencia was seen today for medication refill.  Diagnoses and all orders for this visit:  Essential hypertension, benign -     CBC with Differential/Platelet -     CMP14+EGFR -     TSH + free T4  Acquired hypothyroidism -     CBC with Differential/Platelet -     CMP14+EGFR -     TSH + free T4  Hyperlipidemia with target LDL less than 100 -     CBC with Differential/Platelet -     CMP14+EGFR -     Lipid panel -     TSH + free T4  Asthma without acute exacerbation -     CBC with Differential/Platelet -     CMP14+EGFR  Gastritis and gastroduodenitis -     CBC with Differential/Platelet -     CMP14+EGFR -     pantoprazole (PROTONIX) 40 MG tablet; Take 1 tablet (40 mg total) by mouth daily.  Age-related osteoporosis without current pathological fracture -     CBC with Differential/Platelet -     CMP14+EGFR -     VITAMIN D 25 Hydroxy (Vit-D Deficiency, Fractures) -     alendronate (FOSAMAX) 70 MG tablet; Take 1 tablet (70 mg total) by mouth every 7 (seven) days. Take with a full glass of water on an empty stomach.  Other orders -     budesonide-formoterol (SYMBICORT) 160-4.5 MCG/ACT inhaler; Inhale 2 puffs into the lungs in the morning and at bedtime. -     cetirizine (ZYRTEC ALLERGY) 10 MG tablet; Take 1 tablet (10 mg total) by mouth daily.       I have discontinued Nelda Hagan's  Lactobacillus (PROBIOTIC ACIDOPHILUS PO), linaclotide, and fexofenadine. I have also changed her budesonide-formoterol. Additionally, I am having her maintain her AMBULATORY NON FORMULARY MEDICATION, Ibuprofen, cholecalciferol, vitamin B-12, Calcium Carbonate-Vit D-Min (CALCIUM 1200 PO), Biotin, polyethylene glycol, albuterol, aspirin EC, guaiFENesin, fluticasone, levothyroxine, alendronate, cetirizine, and pantoprazole.  Allergies as of 07/20/2020   No Known Allergies     Medication List  Accurate as of July 20, 2020 10:31 AM. If you have any questions, ask your nurse or doctor.        STOP taking these medications   fexofenadine 180 MG tablet Commonly known as: ALLEGRA Stopped by: Claretta Fraise, MD   linaclotide 145 MCG Caps capsule Commonly known as: LINZESS Stopped by: Claretta Fraise, MD   PROBIOTIC ACIDOPHILUS PO Stopped by: Claretta Fraise, MD     TAKE these medications   albuterol 108 (90 Base) MCG/ACT inhaler Commonly known as: VENTOLIN HFA   alendronate 70 MG tablet Commonly known as: FOSAMAX Take 1 tablet (70 mg total) by mouth every 7 (seven) days. Take with a full glass of water on an empty stomach.   AMBULATORY NON FORMULARY MEDICATION Allergy Shots Take 1 injection every 2 weeks   aspirin EC 81 MG tablet Take by mouth.   Biotin 2500 MCG Caps Take by mouth.   budesonide-formoterol 160-4.5 MCG/ACT inhaler Commonly known as: SYMBICORT Inhale 2 puffs into the lungs in the morning and at bedtime. What changed:   when to take this  reasons to take this Changed by: Claretta Fraise, MD   CALCIUM 1200 PO Take 1 tablet by mouth daily.   cetirizine 10 MG tablet Commonly known as: ZyrTEC Allergy Take 1 tablet (10 mg total) by mouth daily.   cholecalciferol 1000 units tablet Commonly known as: VITAMIN D Take 1,000 Units by mouth daily.   fluticasone 50 MCG/ACT nasal spray Commonly known as: FLONASE Place 2 sprays into both nostrils daily.     guaiFENesin 600 MG 12 hr tablet Commonly known as: MUCINEX Take by mouth 2 (two) times daily.   Ibuprofen 200 MG Caps Take 1 capsule by mouth as needed.   levothyroxine 50 MCG tablet Commonly known as: SYNTHROID Take 1 tablet (50 mcg total) by mouth daily.   pantoprazole 40 MG tablet Commonly known as: PROTONIX Take 1 tablet (40 mg total) by mouth daily.   polyethylene glycol 17 g packet Commonly known as: MIRALAX / GLYCOLAX Take 17 g by mouth daily as needed.   vitamin B-12 1000 MCG tablet Commonly known as: CYANOCOBALAMIN Take 1,000 mcg by mouth daily.     Patient significantly thriving on current regimen.  It is possible that she no longer needs the splint.  Her original diagnosis was in fact thyroiditis that might have healed in the interim since diagnosis.  As result, before refilling levothyroxine I am notes that she has never been on medicine for her cholesterol.  We will take a close look at that as well to her blood work  Follow-up: Return in about 6 months (around 01/17/2021), or if symptoms worsen or fail to improve, for Hypothyroidism, hypertension.  going to wait for the blood work which has been ordered today.  She Claretta Fraise, M.D.

## 2020-07-21 LAB — CMP14+EGFR
ALT: 16 IU/L (ref 0–32)
AST: 16 IU/L (ref 0–40)
Albumin/Globulin Ratio: 1.9 (ref 1.2–2.2)
Albumin: 4.6 g/dL (ref 3.8–4.8)
Alkaline Phosphatase: 81 IU/L (ref 44–121)
BUN/Creatinine Ratio: 17 (ref 12–28)
BUN: 13 mg/dL (ref 8–27)
Bilirubin Total: 0.3 mg/dL (ref 0.0–1.2)
CO2: 22 mmol/L (ref 20–29)
Calcium: 10.1 mg/dL (ref 8.7–10.3)
Chloride: 103 mmol/L (ref 96–106)
Creatinine, Ser: 0.76 mg/dL (ref 0.57–1.00)
GFR calc Af Amer: 96 mL/min/{1.73_m2} (ref >59)
GFR calc non Af Amer: 83 mL/min/{1.73_m2} (ref >59)
Globulin, Total: 2.4 g/dL (ref 1.5–4.5)
Glucose: 90 mg/dL (ref 65–99)
Potassium: 4.5 mmol/L (ref 3.5–5.2)
Sodium: 140 mmol/L (ref 134–144)
Total Protein: 7 g/dL (ref 6.0–8.5)

## 2020-07-21 LAB — CBC WITH DIFFERENTIAL/PLATELET
Basophils Absolute: 0.1 10*3/uL (ref 0.0–0.2)
Basos: 2 %
EOS (ABSOLUTE): 1.5 10*3/uL — ABNORMAL HIGH (ref 0.0–0.4)
Eos: 19 %
Hematocrit: 40.7 % (ref 34.0–46.6)
Hemoglobin: 13.4 g/dL (ref 11.1–15.9)
Immature Grans (Abs): 0 10*3/uL (ref 0.0–0.1)
Immature Granulocytes: 0 %
Lymphocytes Absolute: 2.4 10*3/uL (ref 0.7–3.1)
Lymphs: 30 %
MCH: 29.9 pg (ref 26.6–33.0)
MCHC: 32.9 g/dL (ref 31.5–35.7)
MCV: 91 fL (ref 79–97)
Monocytes Absolute: 0.8 10*3/uL (ref 0.1–0.9)
Monocytes: 10 %
Neutrophils Absolute: 3.1 10*3/uL (ref 1.4–7.0)
Neutrophils: 39 %
Platelets: 378 10*3/uL (ref 150–450)
RBC: 4.48 x10E6/uL (ref 3.77–5.28)
RDW: 12.5 % (ref 11.7–15.4)
WBC: 8 10*3/uL (ref 3.4–10.8)

## 2020-07-21 LAB — LIPID PANEL
Chol/HDL Ratio: 2.8 ratio (ref 0.0–4.4)
Cholesterol, Total: 240 mg/dL — ABNORMAL HIGH (ref 100–199)
HDL: 86 mg/dL (ref >39)
LDL Chol Calc (NIH): 135 mg/dL — ABNORMAL HIGH (ref 0–99)
Triglycerides: 108 mg/dL (ref 0–149)
VLDL Cholesterol Cal: 19 mg/dL (ref 5–40)

## 2020-07-21 LAB — VITAMIN D 25 HYDROXY (VIT D DEFICIENCY, FRACTURES): Vit D, 25-Hydroxy: 33.8 ng/mL (ref 30.0–100.0)

## 2020-07-21 LAB — TSH+FREE T4
Free T4: 1.33 ng/dL (ref 0.82–1.77)
TSH: 4.69 u[IU]/mL — ABNORMAL HIGH (ref 0.450–4.500)

## 2020-07-22 ENCOUNTER — Other Ambulatory Visit: Payer: Self-pay | Admitting: Family Medicine

## 2020-07-22 DIAGNOSIS — E039 Hypothyroidism, unspecified: Secondary | ICD-10-CM

## 2020-07-22 MED ORDER — LEVOTHYROXINE SODIUM 50 MCG PO TABS: 50.0000 ug | ORAL_TABLET | Freq: Every day | ORAL | 1 refills | Status: DC

## 2020-07-22 NOTE — Addendum Note (Signed)
Addended by: Angela Adam on: 07/22/2020 04:26 PM   Modules accepted: Orders

## 2020-07-25 ENCOUNTER — Telehealth: Payer: Self-pay | Admitting: Family Medicine

## 2020-07-25 NOTE — Telephone Encounter (Signed)
Patient returned call regarding labwork.  Results were given and patient aware to pick up rx for Synthroid and restart this medication.  Will retest in 6-8 weeks.

## 2020-07-26 DIAGNOSIS — J3089 Other allergic rhinitis: Secondary | ICD-10-CM | POA: Diagnosis not present

## 2020-08-22 DIAGNOSIS — J3089 Other allergic rhinitis: Secondary | ICD-10-CM | POA: Diagnosis not present

## 2020-08-29 DIAGNOSIS — Z1231 Encounter for screening mammogram for malignant neoplasm of breast: Secondary | ICD-10-CM | POA: Diagnosis not present

## 2020-09-02 ENCOUNTER — Other Ambulatory Visit: Payer: Self-pay

## 2020-09-02 ENCOUNTER — Other Ambulatory Visit: Payer: BC Managed Care – PPO

## 2020-09-02 DIAGNOSIS — E039 Hypothyroidism, unspecified: Secondary | ICD-10-CM

## 2020-09-03 LAB — TSH: TSH: 2.29 u[IU]/mL (ref 0.450–4.500)

## 2020-09-03 LAB — T4, FREE: Free T4: 1.39 ng/dL (ref 0.82–1.77)

## 2020-09-03 NOTE — Progress Notes (Signed)
Hello Adilen,  Your lab result is normal and/or stable.Some minor variations that are not significant are commonly marked abnormal, but do not represent any medical problem for you.  Best regards, Lilyanne Mcquown, M.D.

## 2020-09-12 DIAGNOSIS — J3089 Other allergic rhinitis: Secondary | ICD-10-CM | POA: Diagnosis not present

## 2020-09-28 DIAGNOSIS — J3089 Other allergic rhinitis: Secondary | ICD-10-CM | POA: Diagnosis not present

## 2020-10-12 DIAGNOSIS — J3089 Other allergic rhinitis: Secondary | ICD-10-CM | POA: Diagnosis not present

## 2020-10-16 DIAGNOSIS — R059 Cough, unspecified: Secondary | ICD-10-CM | POA: Diagnosis not present

## 2020-10-16 DIAGNOSIS — J329 Chronic sinusitis, unspecified: Secondary | ICD-10-CM | POA: Diagnosis not present

## 2020-10-16 DIAGNOSIS — R519 Headache, unspecified: Secondary | ICD-10-CM | POA: Diagnosis not present

## 2020-10-16 DIAGNOSIS — Z6825 Body mass index (BMI) 25.0-25.9, adult: Secondary | ICD-10-CM | POA: Diagnosis not present

## 2020-10-26 DIAGNOSIS — J3089 Other allergic rhinitis: Secondary | ICD-10-CM | POA: Diagnosis not present

## 2020-11-02 DIAGNOSIS — Z6825 Body mass index (BMI) 25.0-25.9, adult: Secondary | ICD-10-CM | POA: Diagnosis not present

## 2020-11-02 DIAGNOSIS — R110 Nausea: Secondary | ICD-10-CM | POA: Diagnosis not present

## 2020-11-02 DIAGNOSIS — R197 Diarrhea, unspecified: Secondary | ICD-10-CM | POA: Diagnosis not present

## 2020-11-02 DIAGNOSIS — B349 Viral infection, unspecified: Secondary | ICD-10-CM | POA: Diagnosis not present

## 2020-11-10 DIAGNOSIS — J3089 Other allergic rhinitis: Secondary | ICD-10-CM | POA: Diagnosis not present

## 2020-11-19 DIAGNOSIS — J329 Chronic sinusitis, unspecified: Secondary | ICD-10-CM | POA: Diagnosis not present

## 2020-11-19 DIAGNOSIS — R0981 Nasal congestion: Secondary | ICD-10-CM | POA: Diagnosis not present

## 2020-11-19 DIAGNOSIS — R059 Cough, unspecified: Secondary | ICD-10-CM | POA: Diagnosis not present

## 2020-11-19 DIAGNOSIS — J209 Acute bronchitis, unspecified: Secondary | ICD-10-CM | POA: Diagnosis not present

## 2020-11-23 ENCOUNTER — Ambulatory Visit: Payer: BC Managed Care – PPO | Admitting: Family Medicine

## 2020-11-23 ENCOUNTER — Other Ambulatory Visit: Payer: Self-pay

## 2020-11-23 ENCOUNTER — Encounter: Payer: Self-pay | Admitting: Family Medicine

## 2020-11-23 VITALS — BP 136/77 | HR 70 | Temp 97.3°F | Ht 68.0 in | Wt 167.0 lb

## 2020-11-23 DIAGNOSIS — M940 Chondrocostal junction syndrome [Tietze]: Secondary | ICD-10-CM

## 2020-11-23 MED ORDER — DICLOFENAC SODIUM 75 MG PO TBEC: 75.0000 mg | DELAYED_RELEASE_TABLET | Freq: Two times a day (BID) | ORAL | 0 refills | Status: DC

## 2020-11-23 MED ORDER — BENZONATATE 100 MG PO CAPS: 100.0000 mg | ORAL_CAPSULE | Freq: Three times a day (TID) | ORAL | 1 refills | Status: DC | PRN

## 2020-11-23 NOTE — Patient Instructions (Signed)
Costochondritis  Costochondritis is irritation and swelling (inflammation) of the tissue that connects the ribs to the breastbone (sternum). This tissue is called cartilage. Costochondritis causes pain in the front of the chest. Usually, the pain:  Starts slowly.  Is in more than one rib. What are the causes? The exact cause of this condition is not always known. It results from stress on the tissue in the affected area. The cause of this stress could be:  Chest injury.  Exercise or activity, such as lifting.  Very bad coughing. What increases the risk? You are more likely to develop this condition if you:  Are female.  Are 30-40 years old.  Recently started a new exercise or work activity.  Have low levels of vitamin D.  Have a condition that makes you cough often. What are the signs or symptoms? The main symptom of this condition is chest pain. The pain:  Usually starts slowly and can be sharp or dull.  Gets worse with deep breathing, coughing, or exercise.  Gets better with rest.  May be worse when you press on the affected area of your ribs and breastbone. How is this treated? This condition usually goes away on its own over time. Your doctor may prescribe an NSAID, such as ibuprofen. This can help reduce pain and inflammation. Treatment may also include:  Resting and avoiding activities that make pain worse.  Putting heat or ice on the painful area.  Doing exercises to stretch your chest muscles. If these treatments do not help, your doctor may inject a numbing medicine to help relieve the pain. Follow these instructions at home: Managing pain, stiffness, and swelling  If told, put ice on the painful area. To do this: ? Put ice in a plastic bag. ? Place a towel between your skin and the bag. ? Leave the ice on for 20 minutes, 2-3 times a day.  If told, put heat on the affected area. Do this as often as told by your doctor. Use the heat source that your  doctor recommends, such as a moist heat pack or a heating pad. ? Place a towel between your skin and the heat source. ? Leave the heat on for 20-30 minutes. ? Take off the heat if your skin turns bright red. This is very important if you cannot feel pain, heat, or cold. You may have a greater risk of getting burned.      Activity  Rest as told by your doctor.  Do not do anything that makes your pain worse. This includes any activities that use chest, belly (abdomen), and side muscles.  Do not lift anything that is heavier than 10 lb (4.5 kg), or the limit that you are told, until your doctor says that it is safe.  Return to your normal activities as told by your doctor. Ask your doctor what activities are safe for you. General instructions  Take over-the-counter and prescription medicines only as told by your doctor.  Keep all follow-up visits as told by your doctor. This is important. Contact a doctor if:  You have chills or a fever.  Your pain does not go away or it gets worse.  You have a cough that does not go away. Get help right away if:  You are short of breath.  You have very bad chest pain that is not helped by medicines, heat, or ice. These symptoms may be an emergency. Do not wait to see if the symptoms will go away. Get   medical help right away. Call your local emergency services (911 in the [place] Do not drive yourself to the hospital. Summary  Costochondritis is irritation and swelling (inflammation) of the tissue that connects the ribs to the breastbone (sternum).  This condition causes pain in the front of the chest.  Treatment may include medicines, rest, heat or ice, and exercises. This information is not intended to replace advice given to you by your health care provider. Make sure you discuss any questions you have with your health care provider. Document Revised: 08/28/2019 Document Reviewed: 08/28/2019 Elsevier Patient Education  2021 Elsevier Inc.  

## 2020-11-26 ENCOUNTER — Encounter: Payer: Self-pay | Admitting: Family Medicine

## 2020-11-26 NOTE — Progress Notes (Signed)
Assessment & Plan:  1. Costochondritis - Education provided on costochondritis. Continue prednisone as previously prescribed. Rx'd diclofenac and tessalon perles.  - diclofenac (VOLTAREN) 75 MG EC tablet; Take 1 tablet (75 mg total) by mouth 2 (two) times daily.  Dispense: 30 tablet; Refill: 0 - benzonatate (TESSALON PERLES) 100 MG capsule; Take 1 capsule (100 mg total) by mouth 3 (three) times daily as needed for cough.  Dispense: 30 capsule; Refill: 1  Follow up plan: Return if symptoms worsen or fail to improve.  Natalie Boston, MSN, APRN, FNP-C [facility]  Subjective:   Patient ID: Natalie Valencia, female    DOB: 06-25-1956, 65 y.o.   MRN: [id number]  HPI: Natalie Valencia is a 65 y.o. female presenting on 11/23/2020 for Back Pain (Happens when she coughs worse in on right side )  Patient has been sick with a viral respiratory infection for recently. She reports coughing has hurt something on the right side of her back. It is worse with coughing and deep breathing. She is currently taking prednisone for the URI. She has also been using a heating pad which is somewhat helpful. She does feel it is getting some better. She has not yet got her cough syrup as the pharmacy did not have it in stock.    ROS: Negative unless specifically indicated above in HPI.   Relevant past medical history reviewed and updated as indicated.   Allergies and medications reviewed and updated.   Current Outpatient Medications:  .  albuterol (VENTOLIN HFA) 108 (90 Base) MCG/ACT inhaler, , Disp: , Rfl:  .  alendronate (FOSAMAX) 70 MG tablet, Take 1 tablet (70 mg total) by mouth every 7 (seven) days. Take with a full glass of water on an empty stomach., Disp: 4 tablet, Rfl: 11 .  AMBULATORY NON FORMULARY MEDICATION, Allergy Shots Take 1 injection every 2 weeks, Disp: , Rfl:  .  aspirin EC 81 MG tablet, Take by mouth., Disp: , Rfl:  .  benzonatate (TESSALON PERLES) 100 MG capsule, Take 1  capsule (100 mg total) by mouth 3 (three) times daily as needed for cough., Disp: 30 capsule, Rfl: 1 .  Biotin 2500 MCG CAPS, Take by mouth., Disp: , Rfl:  .  budesonide-formoterol (SYMBICORT) 160-4.5 MCG/ACT inhaler, Inhale 2 puffs into the lungs in the morning and at bedtime., Disp: 30.6 g, Rfl: 3 .  Calcium Carbonate-Vit D-Min (CALCIUM 1200 PO), Take 1 tablet by mouth daily., Disp: , Rfl:  .  cetirizine (ZYRTEC ALLERGY) 10 MG tablet, Take 1 tablet (10 mg total) by mouth daily., Disp: 90 tablet, Rfl: 3 .  cholecalciferol (VITAMIN D) 1000 units tablet, Take 1,000 Units by mouth daily., Disp: , Rfl:  .  diclofenac (VOLTAREN) 75 MG EC tablet, Take 1 tablet (75 mg total) by mouth 2 (two) times daily., Disp: 30 tablet, Rfl: 0 .  fluticasone (FLONASE) 50 MCG/ACT nasal spray, Place 2 sprays into both nostrils daily., Disp: 16 g, Rfl: 6 .  guaiFENesin (MUCINEX) 600 MG 12 hr tablet, Take by mouth 2 (two) times daily., Disp: , Rfl:  .  Ibuprofen 200 MG CAPS, Take 1 capsule by mouth as needed., Disp: , Rfl:  .  levothyroxine (SYNTHROID) 50 MCG tablet, Take 1 tablet (50 mcg total) by mouth daily., Disp: 90 tablet, Rfl: 1 .  pantoprazole (PROTONIX) 40 MG tablet, Take 1 tablet (40 mg total) by mouth daily., Disp: 90 tablet, Rfl: 2 .  polyethylene glycol (MIRALAX / GLYCOLAX) packet, Take 17 g  by mouth daily as needed. , Disp: , Rfl:  .  predniSONE (DELTASONE) 10 MG tablet, Take 4 po qd x 2d then 3 po qd x 2d then 2 po qd x 2d then 1 po qd x 2d then stop, Disp: , Rfl:  .  vitamin B-12 (CYANOCOBALAMIN) 1000 MCG tablet, Take 1,000 mcg by mouth daily., Disp: , Rfl:  .  amoxicillin (AMOXIL) 875 MG tablet, SMARTSIG:1 Tablet(s) By Mouth Every 12 Hours, Disp: , Rfl:   No Known Allergies  Objective:   BP 136/77   Pulse 70   Temp (!) 97.3 F (36.3 C) (Temporal)   Ht 5\' 8"  (1.727 m)   Wt 167 lb (75.8 kg)   SpO2 94%   BMI 25.39 kg/m    Physical Exam Vitals reviewed.  Constitutional:      General: She is not  in acute distress.    Appearance: Normal appearance. She is not ill-appearing, toxic-appearing or diaphoretic.  HENT:     Head: Normocephalic and atraumatic.  Eyes:     General: No scleral icterus.       Right eye: No discharge.        Left eye: No discharge.     Conjunctiva/sclera: Conjunctivae normal.  Cardiovascular:     Rate and Rhythm: Normal rate.  Pulmonary:     Effort: Pulmonary effort is normal. No respiratory distress.  Musculoskeletal:        General: Normal range of motion.     Cervical back: Normal range of motion.     Thoracic back: Tenderness (between ribs on right side) present.  Skin:    General: Skin is warm and dry.     Capillary Refill: Capillary refill takes less than 2 seconds.  Neurological:     General: No focal deficit present.     Mental Status: She is alert and oriented to person, place, and time. Mental status is at baseline.  Psychiatric:        Mood and Affect: Mood normal.        Behavior: Behavior normal.        Thought Content: Thought content normal.        Judgment: Judgment normal.

## 2020-12-01 DIAGNOSIS — J3089 Other allergic rhinitis: Secondary | ICD-10-CM | POA: Diagnosis not present

## 2021-01-03 DIAGNOSIS — Z6825 Body mass index (BMI) 25.0-25.9, adult: Secondary | ICD-10-CM | POA: Diagnosis not present

## 2021-01-03 DIAGNOSIS — J020 Streptococcal pharyngitis: Secondary | ICD-10-CM | POA: Diagnosis not present

## 2021-01-03 DIAGNOSIS — R059 Cough, unspecified: Secondary | ICD-10-CM | POA: Diagnosis not present

## 2021-01-03 DIAGNOSIS — R0981 Nasal congestion: Secondary | ICD-10-CM | POA: Diagnosis not present

## 2021-01-03 DIAGNOSIS — J029 Acute pharyngitis, unspecified: Secondary | ICD-10-CM | POA: Diagnosis not present

## 2021-01-09 DIAGNOSIS — J3089 Other allergic rhinitis: Secondary | ICD-10-CM | POA: Diagnosis not present

## 2021-01-12 DIAGNOSIS — M5442 Lumbago with sciatica, left side: Secondary | ICD-10-CM | POA: Diagnosis not present

## 2021-01-12 DIAGNOSIS — M62830 Muscle spasm of back: Secondary | ICD-10-CM | POA: Diagnosis not present

## 2021-01-12 DIAGNOSIS — M9903 Segmental and somatic dysfunction of lumbar region: Secondary | ICD-10-CM | POA: Diagnosis not present

## 2021-01-12 DIAGNOSIS — M9904 Segmental and somatic dysfunction of sacral region: Secondary | ICD-10-CM | POA: Diagnosis not present

## 2021-01-16 DIAGNOSIS — M5442 Lumbago with sciatica, left side: Secondary | ICD-10-CM | POA: Diagnosis not present

## 2021-01-16 DIAGNOSIS — M9903 Segmental and somatic dysfunction of lumbar region: Secondary | ICD-10-CM | POA: Diagnosis not present

## 2021-01-16 DIAGNOSIS — M62830 Muscle spasm of back: Secondary | ICD-10-CM | POA: Diagnosis not present

## 2021-01-16 DIAGNOSIS — M9904 Segmental and somatic dysfunction of sacral region: Secondary | ICD-10-CM | POA: Diagnosis not present

## 2021-01-17 ENCOUNTER — Ambulatory Visit (INDEPENDENT_AMBULATORY_CARE_PROVIDER_SITE_OTHER): Payer: BC Managed Care – PPO

## 2021-01-17 ENCOUNTER — Ambulatory Visit: Payer: BC Managed Care – PPO | Admitting: Family Medicine

## 2021-01-17 ENCOUNTER — Encounter: Payer: Self-pay | Admitting: Family Medicine

## 2021-01-17 ENCOUNTER — Other Ambulatory Visit: Payer: Self-pay

## 2021-01-17 VITALS — BP 118/62 | HR 87 | Temp 97.5°F | Ht 68.0 in | Wt 160.2 lb

## 2021-01-17 DIAGNOSIS — M810 Age-related osteoporosis without current pathological fracture: Secondary | ICD-10-CM | POA: Diagnosis not present

## 2021-01-17 DIAGNOSIS — I10 Essential (primary) hypertension: Secondary | ICD-10-CM | POA: Diagnosis not present

## 2021-01-17 DIAGNOSIS — E785 Hyperlipidemia, unspecified: Secondary | ICD-10-CM | POA: Diagnosis not present

## 2021-01-17 DIAGNOSIS — K2990 Gastroduodenitis, unspecified, without bleeding: Secondary | ICD-10-CM

## 2021-01-17 DIAGNOSIS — E039 Hypothyroidism, unspecified: Secondary | ICD-10-CM

## 2021-01-17 DIAGNOSIS — M545 Low back pain, unspecified: Secondary | ICD-10-CM

## 2021-01-17 DIAGNOSIS — K2970 Gastritis, unspecified, without bleeding: Secondary | ICD-10-CM

## 2021-01-17 MED ORDER — PREDNISONE 10 MG PO TABS: ORAL_TABLET | ORAL | 0 refills | Status: DC

## 2021-01-17 MED ORDER — RALOXIFENE HCL 60 MG PO TABS
60.0000 mg | ORAL_TABLET | Freq: Every day | ORAL | 3 refills | Status: DC
Start: 2021-01-17 — End: 2022-01-17

## 2021-01-17 NOTE — Progress Notes (Signed)
Subjective:  Patient ID: Natalie Valencia, female    DOB: 1955/11/13  Age: 65 y.o. MRN: [id number]  CC: Medical Management of Chronic Issues   HPI Alysabeth Scalia presents for  follow-up on  thyroid. The patient has a history of hypothyroidism for many years. It has been stable recently. Pt. denies any change in  voice, loss of hair, heat or cold intolerance. Energy level has been adequate to good. Patient denies constipation and diarrhea. No myxedema. Medication is as noted below. Verified that pt is taking it daily on an empty stomach. Well tolerated.  Patient in for follow-up of GERD. Currently asymptomatic taking  PPI daily. There is no chest pain or heartburn. No hematemesis and no melena. No dysphagia or choking. Onset is remote. Progression is stable. Complicating factors, none.  Lumbar back pain onset with cough. 5-8/10. Worse at night with trying to get comfortable. Requesting XR.    Depression screen [facility] 2/9 01/17/2021 11/23/2020 07/20/2020  Decreased Interest 0 0 0  Down, Depressed, Hopeless 0 0 0  PHQ - 2 Score 0 0 0  Altered sleeping - - -  Tired, decreased energy - - -  Change in appetite - - -  Feeling bad or failure about yourself  - - -  Trouble concentrating - - -  Moving slowly or fidgety/restless - - -  Suicidal thoughts - - -  PHQ-9 Score - - -    History Ruey has a past medical history of Allergy, Asthma, Back pain, GERD (gastroesophageal reflux disease), Hyperlipidemia, Hypertension, Osteopenia, and Thyroid disease.   She has a past surgical history that includes Abdominal hysterectomy (2001); Nasal sinus surgery; Colonoscopy; and Upper gastrointestinal endoscopy.   Her family history includes Alcoholism in her maternal grandfather; Cancer in her mother; Diabetes in her father; Stroke in her paternal grandmother.She reports that she has never smoked. She has never used smokeless tobacco. She reports that she does not drink alcohol and does not use  drugs.    ROS Review of Systems  Constitutional: Negative.   HENT: Negative.   Eyes: Negative for visual disturbance.  Respiratory: Negative for shortness of breath.   Cardiovascular: Negative for chest pain.  Gastrointestinal: Negative for abdominal pain.  Musculoskeletal: Negative for arthralgias.    Objective:  BP 118/62   Pulse 87   Temp (!) 97.5 F (36.4 C)   Ht 5' 8"  (1.727 m)   Wt 160 lb 3.2 oz (72.7 kg)   SpO2 98%   BMI 24.36 kg/m   BP Readings from Last 3 Encounters:  01/17/21 118/62  11/23/20 136/77  07/20/20 125/70    Wt Readings from Last 3 Encounters:  01/17/21 160 lb 3.2 oz (72.7 kg)  11/23/20 167 lb (75.8 kg)  07/20/20 168 lb (76.2 kg)     Physical Exam Constitutional:      General: She is not in acute distress.    Appearance: She is well-developed.  HENT:     Head: Normocephalic and atraumatic.  Eyes:     Conjunctiva/sclera: Conjunctivae normal.     Pupils: Pupils are equal, round, and reactive to light.  Neck:     Thyroid: No thyromegaly.  Cardiovascular:     Rate and Rhythm: Normal rate and regular rhythm.     Heart sounds: Normal heart sounds. No murmur heard.   Pulmonary:     Effort: Pulmonary effort is normal. No respiratory distress.     Breath sounds: Normal breath sounds. No wheezing or rales.  Abdominal:  General: Bowel sounds are normal. There is no distension.     Palpations: Abdomen is soft.     Tenderness: There is no abdominal tenderness.  Musculoskeletal:        General: Tenderness (lumbar spinalis at L4-5 bilaterally) present. Normal range of motion.     Cervical back: Normal range of motion and neck supple.     Lumbar back: Spasms and tenderness present. No deformity.  Lymphadenopathy:     Cervical: No cervical adenopathy.  Skin:    General: Skin is warm and dry.  Neurological:     Mental Status: She is alert and oriented to person, place, and time.     Deep Tendon Reflexes: Reflexes are normal and symmetric.   Psychiatric:        Behavior: Behavior normal.        Thought Content: Thought content normal.        Judgment: Judgment normal.       Assessment & Plan:   Samanthajo was seen today for medical management of chronic issues.  Diagnoses and all orders for this visit:  Age-related osteoporosis without current pathological fracture -     CBC with Differential/Platelet -     CMP14+EGFR -     raloxifene (EVISTA) 60 MG tablet; Take 1 tablet (60 mg total) by mouth daily. For bone health  Essential hypertension, benign -     CBC with Differential/Platelet -     CMP14+EGFR  Gastritis and gastroduodenitis -     CBC with Differential/Platelet -     CMP14+EGFR  Hyperlipidemia with target LDL less than 100 -     CBC with Differential/Platelet -     CMP14+EGFR -     Lipid panel  Acquired hypothyroidism -     CBC with Differential/Platelet -     CMP14+EGFR -     TSH + free T4  Lumbar pain -     DG Lumbar Spine 2-3 Views; Future  Other orders -     predniSONE (DELTASONE) 10 MG tablet; Take 5 daily for 2 days followed by 4,3,2 and 1 for 2 days each.       I have discontinued Kay Lovin's amoxicillin, predniSONE, and benzonatate. I am also having her start on raloxifene and predniSONE. Additionally, I am having her maintain her AMBULATORY NON FORMULARY MEDICATION, Ibuprofen, cholecalciferol, vitamin B-12, Calcium Carbonate-Vit D-Min (CALCIUM 1200 PO), Biotin, polyethylene glycol, albuterol, aspirin EC, guaiFENesin, fluticasone, alendronate, budesonide-formoterol, cetirizine, pantoprazole, levothyroxine, and diclofenac.  Allergies as of 01/17/2021   No Known Allergies     Medication List       Accurate as of January 17, 2021  1:31 PM. If you have any questions, ask your nurse or doctor.        STOP taking these medications   amoxicillin 875 MG tablet Commonly known as: AMOXIL Stopped by: Claretta Fraise, MD   benzonatate 100 MG capsule Commonly known as: Scientist, water quality Stopped by: Claretta Fraise, MD     TAKE these medications   albuterol 108 (90 Base) MCG/ACT inhaler Commonly known as: VENTOLIN HFA   alendronate 70 MG tablet Commonly known as: FOSAMAX Take 1 tablet (70 mg total) by mouth every 7 (seven) days. Take with a full glass of water on an empty stomach.   AMBULATORY NON FORMULARY MEDICATION Allergy Shots Take 1 injection every 2 weeks   aspirin EC 81 MG tablet Take by mouth.   Biotin 2500 MCG Caps Take by mouth.   budesonide-formoterol 160-4.5 MCG/ACT  inhaler Commonly known as: SYMBICORT Inhale 2 puffs into the lungs in the morning and at bedtime.   CALCIUM 1200 PO Take 1 tablet by mouth daily.   cetirizine 10 MG tablet Commonly known as: ZyrTEC Allergy Take 1 tablet (10 mg total) by mouth daily.   cholecalciferol 1000 units tablet Commonly known as: VITAMIN D Take 1,000 Units by mouth daily.   diclofenac 75 MG EC tablet Commonly known as: VOLTAREN Take 1 tablet (75 mg total) by mouth 2 (two) times daily.   fluticasone 50 MCG/ACT nasal spray Commonly known as: FLONASE Place 2 sprays into both nostrils daily.   guaiFENesin 600 MG 12 hr tablet Commonly known as: MUCINEX Take by mouth 2 (two) times daily.   Ibuprofen 200 MG Caps Take 1 capsule by mouth as needed.   levothyroxine 50 MCG tablet Commonly known as: SYNTHROID Take 1 tablet (50 mcg total) by mouth daily.   pantoprazole 40 MG tablet Commonly known as: PROTONIX Take 1 tablet (40 mg total) by mouth daily.   polyethylene glycol 17 g packet Commonly known as: MIRALAX / GLYCOLAX Take 17 g by mouth daily as needed.   predniSONE 10 MG tablet Commonly known as: DELTASONE Take 5 daily for 2 days followed by 4,3,2 and 1 for 2 days each. What changed: See the new instructions. Changed by: Claretta Fraise, MD   raloxifene 60 MG tablet Commonly known as: Evista Take 1 tablet (60 mg total) by mouth daily. For bone health Started by: Claretta Fraise, MD    vitamin B-12 1000 MCG tablet Commonly known as: CYANOCOBALAMIN Take 1,000 mcg by mouth daily.        Follow-up: Return in about 6 months (around 07/20/2021).  Claretta Fraise, M.D.

## 2021-01-18 DIAGNOSIS — J3089 Other allergic rhinitis: Secondary | ICD-10-CM | POA: Diagnosis not present

## 2021-01-19 ENCOUNTER — Other Ambulatory Visit: Payer: Self-pay | Admitting: Family Medicine

## 2021-01-19 DIAGNOSIS — S32010A Wedge compression fracture of first lumbar vertebra, initial encounter for closed fracture: Secondary | ICD-10-CM

## 2021-01-19 DIAGNOSIS — S32030A Wedge compression fracture of third lumbar vertebra, initial encounter for closed fracture: Secondary | ICD-10-CM

## 2021-01-23 ENCOUNTER — Other Ambulatory Visit: Payer: BC Managed Care – PPO

## 2021-01-23 ENCOUNTER — Other Ambulatory Visit: Payer: Self-pay

## 2021-01-23 DIAGNOSIS — E785 Hyperlipidemia, unspecified: Secondary | ICD-10-CM | POA: Diagnosis not present

## 2021-01-23 DIAGNOSIS — M810 Age-related osteoporosis without current pathological fracture: Secondary | ICD-10-CM | POA: Diagnosis not present

## 2021-01-23 DIAGNOSIS — I10 Essential (primary) hypertension: Secondary | ICD-10-CM | POA: Diagnosis not present

## 2021-01-23 DIAGNOSIS — E039 Hypothyroidism, unspecified: Secondary | ICD-10-CM | POA: Diagnosis not present

## 2021-01-23 DIAGNOSIS — K2970 Gastritis, unspecified, without bleeding: Secondary | ICD-10-CM | POA: Diagnosis not present

## 2021-01-23 DIAGNOSIS — K2990 Gastroduodenitis, unspecified, without bleeding: Secondary | ICD-10-CM | POA: Diagnosis not present

## 2021-01-24 LAB — CBC WITH DIFFERENTIAL/PLATELET
Basophils Absolute: 0.1 10*3/uL (ref 0.0–0.2)
Basos: 1 %
EOS (ABSOLUTE): 0.3 10*3/uL (ref 0.0–0.4)
Eos: 3 %
Hematocrit: 42 % (ref 34.0–46.6)
Hemoglobin: 13.4 g/dL (ref 11.1–15.9)
Immature Grans (Abs): 0 10*3/uL (ref 0.0–0.1)
Immature Granulocytes: 0 %
Lymphocytes Absolute: 3.8 10*3/uL — ABNORMAL HIGH (ref 0.7–3.1)
Lymphs: 36 %
MCH: 28.7 pg (ref 26.6–33.0)
MCHC: 31.9 g/dL (ref 31.5–35.7)
MCV: 90 fL (ref 79–97)
Monocytes Absolute: 0.9 10*3/uL (ref 0.1–0.9)
Monocytes: 9 %
Neutrophils Absolute: 5.4 10*3/uL (ref 1.4–7.0)
Neutrophils: 51 %
Platelets: 432 10*3/uL (ref 150–450)
RBC: 4.67 x10E6/uL (ref 3.77–5.28)
RDW: 12.3 % (ref 11.7–15.4)
WBC: 10.5 10*3/uL (ref 3.4–10.8)

## 2021-01-24 LAB — LIPID PANEL
Chol/HDL Ratio: 3 ratio (ref 0.0–4.4)
Cholesterol, Total: 255 mg/dL — ABNORMAL HIGH (ref 100–199)
HDL: 85 mg/dL (ref >39)
LDL Chol Calc (NIH): 152 mg/dL — ABNORMAL HIGH (ref 0–99)
Triglycerides: 104 mg/dL (ref 0–149)
VLDL Cholesterol Cal: 18 mg/dL (ref 5–40)

## 2021-01-24 LAB — TSH+FREE T4
Free T4: 1.48 ng/dL (ref 0.82–1.77)
TSH: 1.65 u[IU]/mL (ref 0.450–4.500)

## 2021-01-24 LAB — CMP14+EGFR
ALT: 14 IU/L (ref 0–32)
AST: 15 IU/L (ref 0–40)
Albumin/Globulin Ratio: 1.6 (ref 1.2–2.2)
Albumin: 4.5 g/dL (ref 3.8–4.8)
Alkaline Phosphatase: 133 IU/L — ABNORMAL HIGH (ref 44–121)
BUN/Creatinine Ratio: 22 (ref 12–28)
BUN: 17 mg/dL (ref 8–27)
Bilirubin Total: 0.3 mg/dL (ref 0.0–1.2)
CO2: 23 mmol/L (ref 20–29)
Calcium: 10.5 mg/dL — ABNORMAL HIGH (ref 8.7–10.3)
Chloride: 103 mmol/L (ref 96–106)
Creatinine, Ser: 0.77 mg/dL (ref 0.57–1.00)
Globulin, Total: 2.8 g/dL (ref 1.5–4.5)
Glucose: 84 mg/dL (ref 65–99)
Potassium: 4 mmol/L (ref 3.5–5.2)
Sodium: 142 mmol/L (ref 134–144)
Total Protein: 7.3 g/dL (ref 6.0–8.5)
eGFR: 86 mL/min/{1.73_m2} (ref >59)

## 2021-01-25 ENCOUNTER — Other Ambulatory Visit: Payer: Self-pay | Admitting: Family Medicine

## 2021-01-25 DIAGNOSIS — S32010A Wedge compression fracture of first lumbar vertebra, initial encounter for closed fracture: Secondary | ICD-10-CM

## 2021-01-25 DIAGNOSIS — S32030A Wedge compression fracture of third lumbar vertebra, initial encounter for closed fracture: Secondary | ICD-10-CM

## 2021-01-30 DIAGNOSIS — R6883 Chills (without fever): Secondary | ICD-10-CM | POA: Diagnosis not present

## 2021-01-30 DIAGNOSIS — R197 Diarrhea, unspecified: Secondary | ICD-10-CM | POA: Diagnosis not present

## 2021-01-30 DIAGNOSIS — R110 Nausea: Secondary | ICD-10-CM | POA: Diagnosis not present

## 2021-01-30 DIAGNOSIS — R1110 Vomiting, unspecified: Secondary | ICD-10-CM | POA: Diagnosis not present

## 2021-02-01 DIAGNOSIS — J3089 Other allergic rhinitis: Secondary | ICD-10-CM | POA: Diagnosis not present

## 2021-02-02 ENCOUNTER — Telehealth: Payer: Self-pay | Admitting: Family Medicine

## 2021-02-02 DIAGNOSIS — S32028A Other fracture of second lumbar vertebra, initial encounter for closed fracture: Secondary | ICD-10-CM | POA: Diagnosis not present

## 2021-02-02 DIAGNOSIS — X509XXA Other and unspecified overexertion or strenuous movements or postures, initial encounter: Secondary | ICD-10-CM | POA: Diagnosis not present

## 2021-02-02 DIAGNOSIS — S32018A Other fracture of first lumbar vertebra, initial encounter for closed fracture: Secondary | ICD-10-CM | POA: Diagnosis not present

## 2021-02-02 DIAGNOSIS — M5186 Other intervertebral disc disorders, lumbar region: Secondary | ICD-10-CM | POA: Diagnosis not present

## 2021-02-02 DIAGNOSIS — M5126 Other intervertebral disc displacement, lumbar region: Secondary | ICD-10-CM | POA: Diagnosis not present

## 2021-02-02 DIAGNOSIS — M545 Low back pain, unspecified: Secondary | ICD-10-CM | POA: Diagnosis not present

## 2021-02-02 DIAGNOSIS — M47816 Spondylosis without myelopathy or radiculopathy, lumbar region: Secondary | ICD-10-CM | POA: Diagnosis not present

## 2021-02-02 DIAGNOSIS — S32010A Wedge compression fracture of first lumbar vertebra, initial encounter for closed fracture: Secondary | ICD-10-CM | POA: Diagnosis not present

## 2021-02-02 DIAGNOSIS — M5187 Other intervertebral disc disorders, lumbosacral region: Secondary | ICD-10-CM | POA: Diagnosis not present

## 2021-02-03 ENCOUNTER — Telehealth: Payer: Self-pay

## 2021-02-03 NOTE — Telephone Encounter (Signed)
Attempted to contact patient- mailbox full  

## 2021-02-07 DIAGNOSIS — M4856XA Collapsed vertebra, not elsewhere classified, lumbar region, initial encounter for fracture: Secondary | ICD-10-CM | POA: Diagnosis not present

## 2021-02-14 DIAGNOSIS — M545 Low back pain, unspecified: Secondary | ICD-10-CM | POA: Diagnosis not present

## 2021-02-21 ENCOUNTER — Ambulatory Visit (INDEPENDENT_AMBULATORY_CARE_PROVIDER_SITE_OTHER): Payer: BC Managed Care – PPO | Admitting: Family Medicine

## 2021-02-21 ENCOUNTER — Other Ambulatory Visit: Payer: Self-pay

## 2021-02-21 ENCOUNTER — Encounter: Payer: Self-pay | Admitting: Family Medicine

## 2021-02-21 VITALS — BP 123/57 | HR 80 | Temp 97.6°F | Ht 68.0 in | Wt 159.2 lb

## 2021-02-21 DIAGNOSIS — Z01818 Encounter for other preprocedural examination: Secondary | ICD-10-CM | POA: Diagnosis not present

## 2021-02-21 NOTE — Progress Notes (Signed)
Subjective:  Patient ID: Natalie Valencia, female    DOB: Aug 08, 1956  Age: 65 y.o. MRN: [id number]  CC: Surgical Clearance   HPI [place] N[place] presents for surgical clearance for dorsal vertebroplasty for multiple compressions noted on CT & MRI. MRI per Ortho. Surgeon is Dr. Shon Baton. Having severe mid -to- lower back pain. Likely caused by the compression injuries  Depression screen [facility] 2/9 02/21/2021 01/17/2021 11/23/2020  Decreased Interest 0 0 0  Down, Depressed, Hopeless 0 0 0  PHQ - 2 Score 0 0 0  Altered sleeping - - -  Tired, decreased energy - - -  Change in appetite - - -  Feeling bad or failure about yourself  - - -  Trouble concentrating - - -  Moving slowly or fidgety/restless - - -  Suicidal thoughts - - -  PHQ-9 Score - - -      History Deboraha has a past medical history of Allergy, Asthma, Back pain, GERD (gastroesophageal reflux disease), Hyperlipidemia, Hypertension, Osteopenia, and Thyroid disease.   She has a past surgical history that includes Abdominal hysterectomy (2001); Nasal sinus surgery; Colonoscopy; and Upper gastrointestinal endoscopy.   Her family history includes Alcoholism in her maternal grandfather; Cancer in her mother; Diabetes in her father; Stroke in her paternal grandmother.She reports that she has never smoked. She has never used smokeless tobacco. She reports that she does not drink alcohol and does not use drugs.    ROS Review of Systems  Constitutional: Negative.   HENT: Negative.   Eyes: Negative for visual disturbance.  Respiratory: Negative for shortness of breath.   Cardiovascular: Negative for chest pain.  Gastrointestinal: Negative for abdominal pain.  Musculoskeletal: Negative for arthralgias.    Objective:  BP (!) 123/57   Pulse 80   Temp 97.6 F (36.4 C)   Ht 5\' 8"  (1.727 m)   Wt 159 lb 3.2 oz (72.2 kg)   SpO2 99%   BMI 24.21 kg/m   BP Readings from Last 3 Encounters:  02/21/21 (!) 123/57  01/17/21 118/62   11/23/20 136/77    Wt Readings from Last 3 Encounters:  02/21/21 159 lb 3.2 oz (72.2 kg)  01/17/21 160 lb 3.2 oz (72.7 kg)  11/23/20 167 lb (75.8 kg)     Physical Exam Constitutional:      General: She is not in acute distress.    Appearance: She is well-developed.  HENT:     Head: Normocephalic and atraumatic.  Eyes:     Conjunctiva/sclera: Conjunctivae normal.     Pupils: Pupils are equal, round, and reactive to light.  Neck:     Thyroid: No thyromegaly.  Cardiovascular:     Rate and Rhythm: Normal rate and regular rhythm.     Heart sounds: Normal heart sounds. No murmur heard.   Pulmonary:     Effort: Pulmonary effort is normal. No respiratory distress.     Breath sounds: Normal breath sounds. No wheezing or rales.  Abdominal:     General: Bowel sounds are normal. There is no distension.     Palpations: Abdomen is soft.     Tenderness: There is no abdominal tenderness.  Musculoskeletal:        General: Normal range of motion.     Cervical back: Normal range of motion and neck supple.  Lymphadenopathy:     Cervical: No cervical adenopathy.  Skin:    General: Skin is warm and dry.  Neurological:     Mental Status: She is alert and  oriented to person, place, and time.  Psychiatric:        Behavior: Behavior normal.        Thought Content: Thought content normal.        Judgment: Judgment normal.    EKG shows normal sinus rhythm no ischemic changes.   Assessment & Plan:   Shalandra was seen today for surgical clearance.  Diagnoses and all orders for this visit:  Pre-op exam -     EKG 12-Lead    Patient was counseled to hold all NSAIDs including ibuprofen and diclofenac.  She also should not take the low-dose aspirin until after surgery.  She relates that she has not had any of these medicines in the last few weeks.  Patient should do well with her surgery.  Form completed and sent back to the surgeon stating that she was cleared for surgery.   I have  discontinued [place] Guys Biotin, polyethylene glycol, aspirin EC, fluticasone, alendronate, cetirizine, diclofenac, and predniSONE. I am also having her maintain her AMBULATORY NON FORMULARY MEDICATION, Ibuprofen, cholecalciferol, vitamin B-12, Calcium Carbonate-Vit D-Min (CALCIUM 1200 PO), albuterol, guaiFENesin, budesonide-formoterol, pantoprazole, levothyroxine, raloxifene, Docusate Sodium (COLACE PO), and Fexofenadine HCl (ALLEGRA PO).  Allergies as of 02/21/2021   No Known Allergies     Medication List       Accurate as of February 21, 2021  9:44 PM. If you have any questions, ask your nurse or doctor.        STOP taking these medications   alendronate 70 MG tablet Commonly known as: FOSAMAX Stopped by: Mechele Claude, MD   aspirin EC 81 MG tablet Stopped by: Mechele Claude, MD   Biotin 2500 MCG Caps Stopped by: Mechele Claude, MD   cetirizine 10 MG tablet Commonly known as: ZyrTEC Allergy Stopped by: Mechele Claude, MD   diclofenac 75 MG EC tablet Commonly known as: VOLTAREN Stopped by: Mechele Claude, MD   fluticasone 50 MCG/ACT nasal spray Commonly known as: FLONASE Stopped by: Mechele Claude, MD   polyethylene glycol 17 g packet Commonly known as: MIRALAX / GLYCOLAX Stopped by: Mechele Claude, MD   predniSONE 10 MG tablet Commonly known as: DELTASONE Stopped by: Mechele Claude, MD     TAKE these medications   albuterol 108 (90 Base) MCG/ACT inhaler Commonly known as: VENTOLIN HFA   ALLEGRA PO Take by mouth.   AMBULATORY NON FORMULARY MEDICATION Allergy Shots Take 1 injection every 2 weeks   budesonide-formoterol 160-4.5 MCG/ACT inhaler Commonly known as: SYMBICORT Inhale 2 puffs into the lungs in the morning and at bedtime.   CALCIUM 1200 PO Take 1 tablet by mouth daily.   cholecalciferol 1000 units tablet Commonly known as: VITAMIN D Take 1,000 Units by mouth daily.   COLACE PO Take by mouth.   guaiFENesin 600 MG 12 hr tablet Commonly known  as: MUCINEX Take by mouth 2 (two) times daily.   Ibuprofen 200 MG Caps Take 1 capsule by mouth as needed.   levothyroxine 50 MCG tablet Commonly known as: SYNTHROID Take 1 tablet (50 mcg total) by mouth daily.   pantoprazole 40 MG tablet Commonly known as: PROTONIX Take 1 tablet (40 mg total) by mouth daily.   raloxifene 60 MG tablet Commonly known as: Evista Take 1 tablet (60 mg total) by mouth daily. For bone health   vitamin B-12 1000 MCG tablet Commonly known as: CYANOCOBALAMIN Take 1,000 mcg by mouth daily.      Hold  Follow-up: Return if symptoms worsen or fail to improve.  Claretta Fraise, M.D.

## 2021-02-27 ENCOUNTER — Ambulatory Visit: Payer: BC Managed Care – PPO | Admitting: Family Medicine

## 2021-02-27 ENCOUNTER — Other Ambulatory Visit: Payer: Self-pay

## 2021-02-27 ENCOUNTER — Encounter: Payer: Self-pay | Admitting: Family Medicine

## 2021-02-27 VITALS — BP 132/65 | HR 81 | Temp 97.4°F | Ht 68.0 in | Wt 160.8 lb

## 2021-02-27 DIAGNOSIS — M810 Age-related osteoporosis without current pathological fracture: Secondary | ICD-10-CM

## 2021-02-27 DIAGNOSIS — M5459 Other low back pain: Secondary | ICD-10-CM | POA: Diagnosis not present

## 2021-02-27 MED ORDER — ROSUVASTATIN CALCIUM 5 MG PO TABS: 5.0000 mg | ORAL_TABLET | Freq: Every day | ORAL | 3 refills | Status: DC

## 2021-02-27 MED ORDER — ZOLEDRONIC ACID 5 MG/100ML IV SOLN
5.0000 mg | Freq: Once | INTRAVENOUS | Status: DC
Start: 2021-02-27 — End: 2022-04-10

## 2021-02-27 MED ORDER — PHOSPHA 250 NEUTRAL 155-852-130 MG PO TABS: 1.0000 | ORAL_TABLET | Freq: Every day | ORAL | 1 refills | Status: DC

## 2021-02-27 NOTE — Progress Notes (Signed)
Chief Complaint  Patient presents with  . Osteoporosis    HPI   Patient presents today for concern fortreatment for osteoporosis.  She has multiple thoracic spinal compressions.  Her orthopedist is concerned that if he does vertebroplasty that she will actually put pressure on the vertebral bodies that might compress themselves.  She is in today because he told her that if she were to be treated with Reclast it would help to reduce that risk.  She is taking a daily medication currently,  Evista/raloxifene.  Her most recent bone density shows T score -2.7 in the hip and -1.8 at the spine L1-L4 region.  An MRI was done that showed multiple thoracic compression areas.    PMH: Smoking status noted ROS: Per HPI  Objective: BP 132/65   Pulse 81   Temp (!) 97.4 F (36.3 C)   Ht 5\' 8"  (1.727 m)   Wt 160 lb 12.8 oz (72.9 kg)   SpO2 99%   BMI 24.45 kg/m  Gen: NAD, alert, cooperative with exam HEENT: NCAT, EOMI, PERRL CV: RRR, good S1/S2, no murmur Resp: CTABL, no wheezes, non-labored Ext: No edema, warm Neuro: Alert and oriented, No gross deficits  Assessment and plan:  1. Age-related osteoporosis without current pathological fracture     Meds ordered this encounter  Medications  . zoledronic acid (RECLAST) injection 5 mg  . K Phos Mono-Sod Phos Di & Mono (PHOSPHA 250 NEUTRAL) 155-852-130 MG TABS    Sig: Take 1 tablet by mouth daily.    Dispense:  90 tablet    Refill:  1  . rosuvastatin (CRESTOR) 5 MG tablet    Sig: Take 1 tablet (5 mg total) by mouth daily. For cholesterol    Dispense:  90 tablet    Refill:  3    No orders of the defined types were placed in this encounter.   Follow up as needed.  , MD

## 2021-03-13 ENCOUNTER — Other Ambulatory Visit: Payer: Self-pay | Admitting: Family Medicine

## 2021-03-13 DIAGNOSIS — E039 Hypothyroidism, unspecified: Secondary | ICD-10-CM

## 2021-03-14 ENCOUNTER — Encounter: Payer: Self-pay | Admitting: *Deleted

## 2021-03-20 ENCOUNTER — Ambulatory Visit: Payer: Self-pay | Admitting: Orthopedic Surgery

## 2021-03-20 ENCOUNTER — Inpatient Hospital Stay (HOSPITAL_COMMUNITY): Admission: RE | Admit: 2021-03-20 | Payer: BC Managed Care – PPO | Source: Ambulatory Visit

## 2021-03-20 ENCOUNTER — Telehealth: Payer: Self-pay | Admitting: *Deleted

## 2021-03-20 NOTE — Telephone Encounter (Signed)
She has Asthma. USes symbicort. Should have a PFT first.

## 2021-03-20 NOTE — H&P (Deleted)
  The note originally documented on this encounter has been moved the the encounter in which it belongs.  

## 2021-03-20 NOTE — Telephone Encounter (Signed)
Left detailed voice message for [name]

## 2021-03-20 NOTE — Telephone Encounter (Signed)
Patient had pre op visit with Natalie Valencia last month. The anesthesia plan has changed to general anesthesia. Do you see any issue with this?   Per [name] only call back if there is an issue with new plan.

## 2021-03-20 NOTE — Progress Notes (Signed)
Surgical Instructions    Your procedure is scheduled on Mar 22, 2021.  Report to [facility] Main Entrance "A" at 10:00 A.M., then check in with the Admitting office.  Call this number if you have problems the morning of surgery:  [phone]   If you have any questions prior to your surgery date call [phone]: Open Monday-Friday 8am-4pm    Remember:  Do not eat after midnight the night before your surgery  You may drink clear liquids until 09:00 the morning of your surgery.   Clear liquids allowed are: Water, Non-Citrus Juices (without pulp), Carbonated Beverages, Clear Tea, Black Coffee Only, and Gatorade    Take these medicines the morning of surgery with A SIP OF WATER: Levothyroxine (Synthroid) Pantoprazole (Protonix) Rosuvastatin (Crestor) Raloxifene (Evista)  If Needed: Acetaminophen (Tylenol) Albuterol (Ventolin HFA) inhaler-bring all inhalers with you the day of surgery Benzonatate (Tessalon) Guaifenesin (Mucinex) Hydrocodone-Acetaminophen (Norco) Oxymetazoline (Afrin) nasal spray   As of today, STOP taking any Aspirin (unless otherwise instructed by your surgeon) Pseudoephedrine-Acetaminophen (Tylenol Sinus), Aleve, Naproxen, Ibuprofen, Motrin, Advil, Goody's, BC's, all herbal medications, fish oil, and all vitamins.                     Do not wear jewelry, make up, or nail polish            Do not wear lotions, powders, perfumes/colognes, or deodorant.            Do not shave 48 hours prior to surgery.             Do not bring valuables to the hospital.            [facility] is not responsible for any belongings or valuables.  Do NOT Smoke (Tobacco/Vaping) or drink Alcohol 24 hours prior to your procedure If you use a CPAP at night, you may bring all equipment for your overnight stay.   Contacts, glasses, dentures or bridgework may not be worn into surgery, please bring cases for these belongings   For patients admitted to the hospital, discharge time will  be determined by your treatment team.   Patients discharged the day of surgery will not be allowed to drive home, and someone needs to stay with them for 24 hours.   Special instructions:    Oral Hygiene is also important to reduce your risk of infection.  Remember - BRUSH YOUR TEETH THE MORNING OF SURGERY WITH YOUR REGULAR TOOTHPASTE   [place]- Preparing For Surgery  Before surgery, you can play an important role. Because skin is not sterile, your skin needs to be as free of germs as possible. You can reduce the number of germs on your skin by washing with CHG (chlorahexidine gluconate) Soap before surgery.  CHG is an antiseptic cleaner which kills germs and bonds with the skin to continue killing germs even after washing.     Please do not use if you have an allergy to CHG or antibacterial soaps. If your skin becomes reddened/irritated stop using the CHG.  Do not shave (including legs and underarms) for at least 48 hours prior to first CHG shower. It is OK to shave your face.  Please follow these instructions carefully.    1.  Shower the NIGHT BEFORE SURGERY and the MORNING OF SURGERY with CHG Soap.   If you chose to wash your hair, wash your hair first as usual with your normal shampoo. After you shampoo, rinse your hair and body thoroughly  to remove the shampoo.  Then [employer] and genitals (private parts) with your normal soap and rinse thoroughly to remove soap.  2. After that Use CHG Soap as you would any other liquid soap. You can apply CHG directly to the skin and wash gently with a scrungie or a clean washcloth.   3. Apply the CHG Soap to your body ONLY FROM THE NECK DOWN.  Do not use on open wounds or open sores. Avoid contact with your eyes, ears, mouth and genitals (private parts). Wash Face and genitals (private parts)  with your normal soap.   4. Wash thoroughly, paying special attention to the area where your surgery will be performed.  5. Thoroughly rinse your body  with warm water from the neck down.  6. DO NOT shower/wash with your normal soap after using and rinsing off the CHG Soap.  7. Pat yourself dry with a CLEAN TOWEL.  8. Wear CLEAN PAJAMAS to bed the night before surgery  9. Place CLEAN SHEETS on your bed the night before your surgery  10. DO NOT SLEEP WITH PETS.   Day of Surgery:  Take a shower with CHG soap. Wear Clean/Comfortable clothing the morning of surgery Do not apply any deodorants/lotions.   Remember to brush your teeth WITH YOUR REGULAR TOOTHPASTE.   Please read over the following fact sheets that you were given.

## 2021-03-20 NOTE — H&P (Addendum)
Subjective:   Natalie Valencia is a very pleasant 65 year old woman who presented to my office on 02/07/2021 with significant lumbar pain. Imaging studies including a CT scan demonstrated compression fractures L1-3. Recent MRI shows that the L1 and L3 fractures are more subacute to chronic and that the L2 is more subacute/acute. After discussing risks, benefits, and alternative treatments to surgical intervention the patient has elected to move forward with surgery. The patient is here today for a pre-operative History and Physical. They are scheduled for Kyphoplasty L1, L2, L3 on 03-22-21 with Dr. Shon Baton at [facility].  Patient Active Problem List   Diagnosis Date Noted  . Age-related osteoporosis without current pathological fracture 12/03/2019  . BMI 25.0-25.9,adult 08/28/2019  . Gastritis and gastroduodenitis 05/18/2016  . Allergic rhinitis 05/18/2016  . Asthma without acute exacerbation 08/05/2015  . Essential hypertension, benign 02/17/2013  . Hyperlipidemia with target LDL less than 100 02/17/2013  . Hypothyroidism 02/17/2013   Past Medical History:  Diagnosis Date  . Allergy   . Asthma   . Back pain   . GERD (gastroesophageal reflux disease)   . Hyperlipidemia    borderline diet controlled  . Hypertension    situational no meds  . Osteopenia   . Thyroid disease     Past Surgical History:  Procedure Laterality Date  . ABDOMINAL HYSTERECTOMY  2001  . COLONOSCOPY    . NASAL SINUS SURGERY    . UPPER GASTROINTESTINAL ENDOSCOPY      Current Outpatient Medications  Medication Sig Dispense Refill Last Dose  . acetaminophen (TYLENOL) 500 MG tablet Take 1,000 mg by mouth every 8 (eight) hours as needed for moderate pain.     Marland Kitchen albuterol (VENTOLIN HFA) 108 (90 Base) MCG/ACT inhaler Inhale 2 puffs into the lungs every 4 (four) hours as needed for wheezing or shortness of breath.     . AMBULATORY NON FORMULARY MEDICATION Allergy Shots Take 1 injection every 2 weeks     .  benzonatate (TESSALON) 100 MG capsule Take 100 mg by mouth 3 (three) times daily as needed for cough.     . budesonide-formoterol (SYMBICORT) 160-4.5 MCG/ACT inhaler Inhale 2 puffs into the lungs in the morning and at bedtime. 30.6 g 3   . Calcium Carbonate-Vit D-Min (CALCIUM 1200 PO) Take 1 tablet by mouth daily.     . Cholecalciferol (VITAMIN D) 50 MCG (2000 UT) CAPS Take 2,000 Units by mouth daily.     Marland Kitchen guaiFENesin (MUCINEX) 600 MG 12 hr tablet Take 600 mg by mouth 2 (two) times daily as needed for cough or to loosen phlegm.     Marland Kitchen HYDROcodone-acetaminophen (NORCO/VICODIN) 5-325 MG tablet Take 1 tablet by mouth every 6 (six) hours as needed for moderate pain.     . K Phos Mono-Sod Phos Di & Mono (PHOSPHA 250 NEUTRAL) 155-852-130 MG TABS Take 1 tablet by mouth daily. 90 tablet 1   . levothyroxine (SYNTHROID) 50 MCG tablet Take 1 tablet by mouth once daily (Patient taking differently: Take 50 mcg by mouth daily before breakfast.) 90 tablet 3   . oxymetazoline (AFRIN) 0.05 % nasal spray Place 1 spray into both nostrils 2 (two) times daily as needed for congestion.     . pantoprazole (PROTONIX) 40 MG tablet Take 1 tablet (40 mg total) by mouth daily. 90 tablet 2   . pseudoephedrine-acetaminophen (TYLENOL SINUS) 30-500 MG TABS tablet Take 1 tablet by mouth every 4 (four) hours as needed (congestion).     . raloxifene (EVISTA) 60  MG tablet Take 1 tablet (60 mg total) by mouth daily. For bone health 90 tablet 3   . rosuvastatin (CRESTOR) 5 MG tablet Take 1 tablet (5 mg total) by mouth daily. For cholesterol 90 tablet 3   . vitamin B-12 (CYANOCOBALAMIN) 1000 MCG tablet Take 1,000 mcg by mouth daily.      Current Facility-Administered Medications  Medication Dose Route Frequency Provider Last Rate Last Admin  . zoledronic acid (RECLAST) injection 5 mg  5 mg Intravenous Once Mechele Claude, MD       No Known Allergies  Social History   Tobacco Use  . Smoking status: Never Smoker  . Smokeless  tobacco: Never Used  Substance Use Topics  . Alcohol use: No    Family History  Problem Relation Age of Onset  . Cancer Mother        female region- unknown  . Diabetes Father   . Stroke Paternal Grandmother   . Alcoholism Maternal Grandfather   . Colon cancer Neg Hx   . Esophageal cancer Neg Hx   . Stomach cancer Neg Hx   . Rectal cancer Neg Hx     Review of Systems Pertinent items are noted in HPI.  Objective:   Vitals: Ht: 5 ft 8 in 03/20/2021 09:19 am BP: 156/73 sitting L arm 03/20/2021 09:22 am Pulse: 68 bpm 03/20/2021 09:22 am  General: AAOX3, well developed and well nourished, NAD Ambulation: Normal gait pattern, no assistive device Inspection: No obvious deformity Heart: Regular rate and rhythm, no rubs, murmurs, or gallops Lungs: Clear auscultation bilaterally Abdomen: Bowel sounds 4, nondistended, nontender,, no rebound tenderness, no loss of bladder or bowel control Palpation: Tender over spinous processes thorught lumbar spine, No significant T-spine tenderness. AROM: - Severe pain with ROM of lumbar spine. - Knee: flexion and extension normal and pain free bilaterally. - Ankle: Dorsiflexion, plantarflexion, inversion, eversion normal and pain free. Dermatomes: Lower extremity sensation to light touch intact bilaterally. No radicular leg pain. Myotomes: - Hip Flexion: Left 5/5, Right 5/5 - Knee Extension: Left 5/5, Right 5/5 - Ankle Dorsiflextion: Left 5/5, Right 5/5 - Ankle Plantarflexion: Left 5/5, Right 5/5 Reflexes: - Clonus: Negative PV: Extremities warm and well profused. Posterior and dorsalis pedis pulse 2+ bilaterally, No pitting Edema, discoloration, calf tenderness X-Ray impression: AP, lateral, spot lumbar films were taken at today's office visit and images were personally reviewed by me. Patient does appear to have multiple compression deformities at L3, L2, L1, and T11. T11 is the lowest thoracic vertebrae visualized so I am unable to comment  on any other possible thoracic compression fractures. There does not appear to be any significant retropulsion. None of these fractures were present a vertebral plana and I think they would all be amendable to a kyphoplasty procedure. All of the fractures are time indeterminate. Lumbar MRI: completed on 02/14/21 was reviewed with the patient. It was completed at [facility]; I have independently reviewed the images as well as the radiology report. Subacute to chronic benign appearing compression fracture of L1 and L3. Acute/subacute fracture of L2. No significant spinal stenosis or degenerative disc disease is noted except for significant collapse of the L5-S1 disc space. The degenerative disc disease at L5-S1 does contribute to foraminal stenosis. No scoliosis or spondylolisthesis. No abnormal marrow signal change other than the edema consistent with the acute/subacute fracture.  Assessment:   Natalie Valencia is a pleasant 65 year old female who initially presented to our office with severe lumbar pain on 02/07/2021, she was found to have  multiple compression fractures in her lumbar spine.Recent MRI shows that the L1 and L3 fractures are more subacute to chronic and that the L2 is more subacute/acute. On her office visit with Dr. Shon Baton on 02/27/2021 she was noted to have improved pain at that point they did not discuss moving forward with surgery, however the following day patient contacted Dr. Shon Baton via phone call stating that the pain had worsened and that she cannot live with this anymore and would like to move forward with surgical intervention. At that time they discussed moving forward with a 3 level kyphoplasty which will be done under general anesthesia.  Plan:   L1, L2, L3 kyphoplasty  I have asked her to talk with her primary care physician about a more comprehensive metabolic bone workup or perhaps even endocrinology consult. Given the fact that she is on oral meds and she is still having fractures  indicates that she has failed oral meds and may need IV treatment.  Risks of surgery include: Infection, bleeding, death, stroke, paralysis, nerve damage, leak of cement, need for additional surgery including open decompression. Ongoing or worse pain.   Goals of surgery: Reduction in pain, and improvement in quality of life  We have obtained preoperative medical clearance and the patient's primary care provider to IV sedation and local anasthesia. It should be noted that on the clearance form that was sent over it was written that this will be done with IV sedation and local anesthesia. I have personally contacted the patient's primary care provider's office to let them know that we would now like to do the procedure with general anesthesia, Due to a history of asthma, the PCP is requesting PFT prior to proceeding with general anasthesia.   We have also discussed the post-operative recovery period to include: bathing/showering restrictions, wound healing, activity (and driving) restrictions, medications/pain mangement.  We have also discussed post-operative redflags to include: signs and symptoms of postoperative infection, DVT/PE.  All patients questions were invited and answered  Follow-up: 2 weeks postop   Addendum Dictation: We will plan to move forward with a 3 level Kypho with IV sedation and general anasthesia. Patient has been cleared by PCP for Kypho with IV sedation and local. I contacted the PCP office to determine if the patient would be cleared to undergo general anasthesia rather than IV sedation and local anasthesia. The PCP recommended PFT prior to general anasthesia. After discussion with Dr. Shon Baton the patient was given the option to proceed with the procedure with IV seadation and local anasthesia or postpone the procedure until she had PFT's and updated clearance from PCP stating that she may undergo general anasthesia. The patient has elected to move forward with the procedure  under IV and local.

## 2021-03-20 NOTE — Telephone Encounter (Signed)
[name] returned missed call and said that she listened to nurse Jaime's message and said that after speaking with Dr Shon Baton and patient, their plan is to proceed with local IV instead of general which patient has already been previously cleared for.

## 2021-03-21 ENCOUNTER — Other Ambulatory Visit: Payer: Self-pay

## 2021-03-21 ENCOUNTER — Encounter (HOSPITAL_COMMUNITY): Payer: Self-pay

## 2021-03-21 ENCOUNTER — Ambulatory Visit (HOSPITAL_COMMUNITY)
Admission: RE | Admit: 2021-03-21 | Discharge: 2021-03-21 | Disposition: A | Discharge status: Home / Self Care | Payer: BC Managed Care – PPO | Source: Ambulatory Visit | Attending: Orthopedic Surgery | Admitting: Orthopedic Surgery

## 2021-03-21 ENCOUNTER — Encounter (HOSPITAL_COMMUNITY)
Admission: RE | Admit: 2021-03-21 | Discharge: 2021-03-21 | Disposition: A | Discharge status: Home / Self Care | Payer: BC Managed Care – PPO | Source: Ambulatory Visit | Attending: Orthopedic Surgery | Admitting: Orthopedic Surgery

## 2021-03-21 DIAGNOSIS — Z20822 Contact with and (suspected) exposure to covid-19: Secondary | ICD-10-CM | POA: Diagnosis not present

## 2021-03-21 DIAGNOSIS — Z01818 Encounter for other preprocedural examination: Secondary | ICD-10-CM | POA: Insufficient documentation

## 2021-03-21 DIAGNOSIS — M4856XA Collapsed vertebra, not elsewhere classified, lumbar region, initial encounter for fracture: Secondary | ICD-10-CM | POA: Diagnosis not present

## 2021-03-21 HISTORY — DX: Hypothyroidism, unspecified: E03.9

## 2021-03-21 LAB — PROTIME-INR
INR: 1 (ref 0.8–1.2)
Prothrombin Time: 13.3 seconds (ref 11.4–15.2)

## 2021-03-21 LAB — CBC
HCT: 45.1 % (ref 36.0–46.0)
Hemoglobin: 13.9 g/dL (ref 12.0–15.0)
MCH: 28.8 pg (ref 26.0–34.0)
MCHC: 30.8 g/dL (ref 30.0–36.0)
MCV: 93.6 fL (ref 80.0–100.0)
Platelets: 374 10*3/uL (ref 150–400)
RBC: 4.82 MIL/uL (ref 3.87–5.11)
RDW: 13.1 % (ref 11.5–15.5)
WBC: 8 10*3/uL (ref 4.0–10.5)
nRBC: 0 % (ref 0.0–0.2)

## 2021-03-21 LAB — URINALYSIS, ROUTINE W REFLEX MICROSCOPIC
Bilirubin Urine: NEGATIVE
Glucose, UA: NEGATIVE mg/dL
Ketones, ur: NEGATIVE mg/dL
Nitrite: NEGATIVE
Protein, ur: NEGATIVE mg/dL
Specific Gravity, Urine: 1.023 (ref 1.005–1.030)
pH: 5 (ref 5.0–8.0)

## 2021-03-21 LAB — BASIC METABOLIC PANEL
Anion gap: 9 (ref 5–15)
BUN: 12 mg/dL (ref 8–23)
CO2: 27 mmol/L (ref 22–32)
Calcium: 9.9 mg/dL (ref 8.9–10.3)
Chloride: 104 mmol/L (ref 98–111)
Creatinine, Ser: 0.83 mg/dL (ref 0.44–1.00)
GFR, Estimated: >60 mL/min (ref >60)
Glucose, Bld: 100 mg/dL — ABNORMAL HIGH (ref 70–99)
Potassium: 3.9 mmol/L (ref 3.5–5.1)
Sodium: 140 mmol/L (ref 135–145)

## 2021-03-21 LAB — SURGICAL PCR SCREEN
MRSA, PCR: NEGATIVE
Staphylococcus aureus: NEGATIVE

## 2021-03-21 LAB — SARS CORONAVIRUS 2 (TAT 6-24 HRS): SARS Coronavirus 2: NEGATIVE

## 2021-03-21 LAB — APTT: aPTT: 28 seconds (ref 24–36)

## 2021-03-21 NOTE — Progress Notes (Signed)
PCP - Mechele Claude, MD Cardiologist - Denies  PPM/ICD - Denies  Chest x-ray - 03/21/21 EKG - 02/21/21 Stress Test - Denies ECHO - Denies Cardiac Cath - Denies  Sleep Study - Denies  Patient denies being diabetic.  Blood Thinner Instructions: N/A Aspirin Instructions: N/A  ERAS Protcol - Yes PRE-SURGERY Ensure or G2- Not ordered  COVID TEST- 03/21/21 (Patient posted as "Outpatient In Bed")   Anesthesia review: Yes, Per MD order  Patient denies shortness of breath, fever, cough and chest pain at PAT appointment   All instructions explained to the patient, with a verbal understanding of the material. Patient agrees to go over the instructions while at home for a better understanding. Patient also instructed to self quarantine after being tested for COVID-19. The opportunity to ask questions was provided.

## 2021-03-21 NOTE — Anesthesia Preprocedure Evaluation (Addendum)
Anesthesia Evaluation  Patient identified by MRN, date of birth, ID band Patient awake    Reviewed: Allergy & Precautions, H&P , NPO status , Patient's Chart, lab work & pertinent test results  Airway Mallampati: II   Neck ROM: full    Dental   Pulmonary asthma ,    breath sounds clear to auscultation       Cardiovascular hypertension,  Rhythm:regular Rate:Normal     Neuro/Psych    GI/Hepatic GERD  ,  Endo/Other  Hypothyroidism   Renal/GU      Musculoskeletal L1, L2, L3 compression fxs   Abdominal   Peds  Hematology   Anesthesia Other Findings   Reproductive/Obstetrics                            Anesthesia Physical Anesthesia Plan  ASA: II  Anesthesia Plan: MAC   Post-op Pain Management:    Induction: Intravenous  PONV Risk Score and Plan: 2 and Propofol infusion, Midazolam, Ondansetron and Treatment may vary due to age or medical condition  Airway Management Planned: Simple Face Mask  Additional Equipment:   Intra-op Plan:   Post-operative Plan:   Informed Consent: I have reviewed the patients History and Physical, chart, labs and discussed the procedure including the risks, benefits and alternatives for the proposed anesthesia with the patient or authorized representative who has indicated his/her understanding and acceptance.     Dental advisory given  Plan Discussed with: CRNA, Anesthesiologist and Surgeon  Anesthesia Plan Comments: (See APP note by Durel Salts, FNP )       Anesthesia Quick Evaluation

## 2021-03-21 NOTE — Progress Notes (Signed)
Anesthesia Chart Review:   Case: [id number] Date/Time: 03/22/21 1153   Procedure: KYPHOPLASTY L1, L2 and L3 (N/A )   Anesthesia type: General   Pre-op diagnosis: L1, L2 and L3 compression fractures   [facility] ROOM [facility] / [facility]   Surgeons: Venita Lick, MD      DISCUSSION: Pt is 65 years old with hx situational HTN, asthma   VS: BP (!) 151/61   Pulse 68   Temp 36.8 C (Oral)   Resp 17   Ht 5\' 7"  (1.702 m)   Wt 72.3 kg   SpO2 99%   BMI 24.97 kg/m   PROVIDERS: - PCP is , MD who cleared pt at office visit on 02/21/21   LABS: Labs reviewed: Acceptable for surgery. (all labs ordered are listed, but only abnormal results are displayed)  Labs Reviewed  BASIC METABOLIC PANEL - Abnormal; Notable for the following components:      Result Value   Glucose, Bld 100 (*)    All other components within normal limits  URINALYSIS, ROUTINE W REFLEX MICROSCOPIC - Abnormal; Notable for the following components:   APPearance HAZY (*)    Hgb urine dipstick SMALL (*)    Leukocytes,Ua SMALL (*)    Bacteria, UA FEW (*)    All other components within normal limits  SURGICAL PCR SCREEN  SARS CORONAVIRUS 2 (TAT 6-24 HRS)  CBC  PROTIME-INR  APTT     IMAGES: CXR 03/21/21: results pending   EKG 02/22/19: SR   CV: N/A  Past Medical History:  Diagnosis Date  . Allergy   . Asthma   . Back pain   . GERD (gastroesophageal reflux disease)   . Hyperlipidemia    borderline diet controlled  . Hypertension    situational no meds  . Hypothyroidism   . Osteopenia   . Thyroid disease     Past Surgical History:  Procedure Laterality Date  . ABDOMINAL HYSTERECTOMY  2001  . COLONOSCOPY    . DILATION AND CURETTAGE OF UTERUS    . NASAL SINUS SURGERY    . UPPER GASTROINTESTINAL ENDOSCOPY      MEDICATIONS: . acetaminophen (TYLENOL) 500 MG tablet  . albuterol (VENTOLIN HFA) 108 (90 Base) MCG/ACT inhaler  . AMBULATORY NON FORMULARY MEDICATION  . benzonatate (TESSALON)  100 MG capsule  . budesonide-formoterol (SYMBICORT) 160-4.5 MCG/ACT inhaler  . Calcium Carbonate-Vit D-Min (CALCIUM 1200 PO)  . Cholecalciferol (VITAMIN D) 50 MCG (2000 UT) CAPS  . guaiFENesin (MUCINEX) 600 MG 12 hr tablet  . HYDROcodone-acetaminophen (NORCO/VICODIN) 5-325 MG tablet  . K Phos Mono-Sod Phos Di & Mono (PHOSPHA 250 NEUTRAL) 155-852-130 MG TABS  . levothyroxine (SYNTHROID) 50 MCG tablet  . oxymetazoline (AFRIN) 0.05 % nasal spray  . pantoprazole (PROTONIX) 40 MG tablet  . pseudoephedrine-acetaminophen (TYLENOL SINUS) 30-500 MG TABS tablet  . raloxifene (EVISTA) 60 MG tablet  . rosuvastatin (CRESTOR) 5 MG tablet  . vitamin B-12 (CYANOCOBALAMIN) 1000 MCG tablet   . zoledronic acid (RECLAST) injection 5 mg    If CXR results normal, I anticipate pt can proceed with surgery as scheduled.  2002, PhD, FNP-BC [facility]/Anesthesiology Phone: [phone] 03/21/2021 4:41 PM

## 2021-03-22 ENCOUNTER — Encounter (HOSPITAL_COMMUNITY)
Admission: RE | Disposition: A | Discharge status: Home / Self Care | Payer: Self-pay | Source: Home / Self Care | Attending: Orthopedic Surgery

## 2021-03-22 ENCOUNTER — Ambulatory Visit (HOSPITAL_COMMUNITY): Payer: BC Managed Care – PPO | Admitting: Anesthesiology

## 2021-03-22 ENCOUNTER — Ambulatory Visit (HOSPITAL_COMMUNITY)
Admission: RE | Admit: 2021-03-22 | Discharge: 2021-03-22 | Disposition: A | Discharge status: Home / Self Care | Payer: BC Managed Care – PPO | Attending: Orthopedic Surgery | Admitting: Orthopedic Surgery

## 2021-03-22 ENCOUNTER — Ambulatory Visit (HOSPITAL_COMMUNITY): Payer: BC Managed Care – PPO

## 2021-03-22 ENCOUNTER — Ambulatory Visit (HOSPITAL_COMMUNITY): Payer: BC Managed Care – PPO | Admitting: Emergency Medicine

## 2021-03-22 DIAGNOSIS — Z79890 Hormone replacement therapy (postmenopausal): Secondary | ICD-10-CM | POA: Diagnosis not present

## 2021-03-22 DIAGNOSIS — Z981 Arthrodesis status: Secondary | ICD-10-CM | POA: Diagnosis not present

## 2021-03-22 DIAGNOSIS — E785 Hyperlipidemia, unspecified: Secondary | ICD-10-CM | POA: Diagnosis not present

## 2021-03-22 DIAGNOSIS — E039 Hypothyroidism, unspecified: Secondary | ICD-10-CM | POA: Diagnosis not present

## 2021-03-22 DIAGNOSIS — M8088XA Other osteoporosis with current pathological fracture, vertebra(e), initial encounter for fracture: Secondary | ICD-10-CM | POA: Diagnosis not present

## 2021-03-22 DIAGNOSIS — Z7951 Long term (current) use of inhaled steroids: Secondary | ICD-10-CM | POA: Diagnosis not present

## 2021-03-22 DIAGNOSIS — Z419 Encounter for procedure for purposes other than remedying health state, unspecified: Secondary | ICD-10-CM

## 2021-03-22 DIAGNOSIS — Z79899 Other long term (current) drug therapy: Secondary | ICD-10-CM | POA: Insufficient documentation

## 2021-03-22 DIAGNOSIS — M8008XA Age-related osteoporosis with current pathological fracture, vertebra(e), initial encounter for fracture: Secondary | ICD-10-CM | POA: Insufficient documentation

## 2021-03-22 DIAGNOSIS — I10 Essential (primary) hypertension: Secondary | ICD-10-CM | POA: Diagnosis not present

## 2021-03-22 HISTORY — PX: KYPHOPLASTY: SHX5884

## 2021-03-22 SURGERY — KYPHOPLASTY
Anesthesia: Monitor Anesthesia Care | Site: Back

## 2021-03-22 MED ORDER — LIDOCAINE 2% (20 MG/ML) 5 ML SYRINGE
INTRAMUSCULAR | Status: AC
  Filled 2021-03-22: qty 5

## 2021-03-22 MED ORDER — CEFAZOLIN SODIUM-DEXTROSE 2-4 GM/100ML-% IV SOLN
2.0000 g | INTRAVENOUS | Status: AC
  Administered 2021-03-22: 2 g via INTRAVENOUS

## 2021-03-22 MED ORDER — ORAL CARE MOUTH RINSE: 15.0000 mL | Freq: Once | OROMUCOSAL | Status: AC

## 2021-03-22 MED ORDER — MIDAZOLAM HCL 2 MG/2ML IJ SOLN
INTRAMUSCULAR | Status: AC
  Filled 2021-03-22: qty 2

## 2021-03-22 MED ORDER — BUPIVACAINE LIPOSOME 1.3 % IJ SUSP
INTRAMUSCULAR | Status: AC
  Filled 2021-03-22: qty 20

## 2021-03-22 MED ORDER — CHLORHEXIDINE GLUCONATE 0.12 % MT SOLN: 15.0000 mL | Freq: Once | OROMUCOSAL | Status: AC

## 2021-03-22 MED ORDER — ONDANSETRON HCL 4 MG/2ML IJ SOLN: 4.0000 mg | Freq: Four times a day (QID) | INTRAMUSCULAR | Status: DC | PRN

## 2021-03-22 MED ORDER — ONDANSETRON HCL 4 MG/2ML IJ SOLN
INTRAMUSCULAR | Status: DC | PRN
  Administered 2021-03-22: 4 mg via INTRAVENOUS

## 2021-03-22 MED ORDER — OXYCODONE HCL 5 MG PO TABS
5.0000 mg | ORAL_TABLET | Freq: Once | ORAL | Status: DC | PRN
Start: 2021-03-22 — End: 2021-03-22

## 2021-03-22 MED ORDER — FENTANYL CITRATE (PF) 250 MCG/5ML IJ SOLN
INTRAMUSCULAR | Status: DC | PRN
  Administered 2021-03-22 (×2): 25 ug via INTRAVENOUS
  Administered 2021-03-22: 50 ug via INTRAVENOUS
  Administered 2021-03-22 (×2): 25 ug via INTRAVENOUS

## 2021-03-22 MED ORDER — ONDANSETRON HCL 4 MG PO TABS: 4.0000 mg | ORAL_TABLET | Freq: Three times a day (TID) | ORAL | 0 refills | Status: AC | PRN

## 2021-03-22 MED ORDER — FENTANYL CITRATE (PF) 250 MCG/5ML IJ SOLN
INTRAMUSCULAR | Status: AC
  Filled 2021-03-22: qty 5

## 2021-03-22 MED ORDER — FENTANYL CITRATE (PF) 100 MCG/2ML IJ SOLN
INTRAMUSCULAR | Status: AC
  Filled 2021-03-22: qty 2

## 2021-03-22 MED ORDER — LACTATED RINGERS IV SOLN: INTRAVENOUS | Status: DC | PRN

## 2021-03-22 MED ORDER — BUPIVACAINE-EPINEPHRINE (PF) 0.25% -1:200000 IJ SOLN
INTRAMUSCULAR | Status: AC
  Filled 2021-03-22: qty 30

## 2021-03-22 MED ORDER — BUPIVACAINE-EPINEPHRINE 0.25% -1:200000 IJ SOLN
INTRAMUSCULAR | Status: DC | PRN
  Administered 2021-03-22 (×2): 30 mL

## 2021-03-22 MED ORDER — CEFAZOLIN SODIUM-DEXTROSE 2-4 GM/100ML-% IV SOLN
INTRAVENOUS | Status: AC
  Filled 2021-03-22: qty 100

## 2021-03-22 MED ORDER — 0.9 % SODIUM CHLORIDE (POUR BTL) OPTIME
TOPICAL | Status: DC | PRN
  Administered 2021-03-22: 1000 mL

## 2021-03-22 MED ORDER — IOPAMIDOL (ISOVUE-300) INJECTION 61%
INTRAVENOUS | Status: DC | PRN
  Administered 2021-03-22 (×2): 50 mL

## 2021-03-22 MED ORDER — BUPIVACAINE LIPOSOME 1.3 % IJ SUSP
INTRAMUSCULAR | Status: DC | PRN
  Administered 2021-03-22 (×2): 20 mL

## 2021-03-22 MED ORDER — PROPOFOL 10 MG/ML IV BOLUS
INTRAVENOUS | Status: AC
  Filled 2021-03-22: qty 20

## 2021-03-22 MED ORDER — TRAMADOL HCL 50 MG PO TABS: 50.0000 mg | ORAL_TABLET | Freq: Four times a day (QID) | ORAL | 0 refills | Status: AC | PRN

## 2021-03-22 MED ORDER — DEXAMETHASONE SODIUM PHOSPHATE 10 MG/ML IJ SOLN
INTRAMUSCULAR | Status: AC
  Filled 2021-03-22: qty 1

## 2021-03-22 MED ORDER — ACETAMINOPHEN 10 MG/ML IV SOLN
INTRAVENOUS | Status: AC
  Filled 2021-03-22: qty 100

## 2021-03-22 MED ORDER — DEXAMETHASONE SODIUM PHOSPHATE 10 MG/ML IJ SOLN
INTRAMUSCULAR | Status: DC | PRN
  Administered 2021-03-22: 5 mg via INTRAVENOUS

## 2021-03-22 MED ORDER — MIDAZOLAM HCL 2 MG/2ML IJ SOLN
INTRAMUSCULAR | Status: DC | PRN
  Administered 2021-03-22: 2 mg via INTRAVENOUS

## 2021-03-22 MED ORDER — ONDANSETRON HCL 4 MG/2ML IJ SOLN
INTRAMUSCULAR | Status: AC
  Filled 2021-03-22: qty 2

## 2021-03-22 MED ORDER — FENTANYL CITRATE (PF) 100 MCG/2ML IJ SOLN
25.0000 ug | INTRAMUSCULAR | Status: DC | PRN
  Administered 2021-03-22: 25 ug via INTRAVENOUS

## 2021-03-22 MED ORDER — ACETAMINOPHEN 10 MG/ML IV SOLN
INTRAVENOUS | Status: DC | PRN
  Administered 2021-03-22: 1000 mg via INTRAVENOUS

## 2021-03-22 MED ORDER — LACTATED RINGERS IV SOLN: INTRAVENOUS | Status: DC

## 2021-03-22 MED ORDER — LIDOCAINE HCL (CARDIAC) PF 100 MG/5ML IV SOSY
PREFILLED_SYRINGE | INTRAVENOUS | Status: DC | PRN
  Administered 2021-03-22: 60 mg via INTRATRACHEAL

## 2021-03-22 MED ORDER — PROPOFOL 500 MG/50ML IV EMUL
INTRAVENOUS | Status: DC | PRN
  Administered 2021-03-22: 125 ug/kg/min via INTRAVENOUS

## 2021-03-22 MED ORDER — OXYCODONE HCL 5 MG/5ML PO SOLN: 5.0000 mg | Freq: Once | ORAL | Status: DC | PRN

## 2021-03-22 MED ORDER — CHLORHEXIDINE GLUCONATE 0.12 % MT SOLN
OROMUCOSAL | Status: AC
  Administered 2021-03-22: 15 mL via OROMUCOSAL
  Filled 2021-03-22: qty 15

## 2021-03-22 SURGICAL SUPPLY — 45 items
ADH SKN CLS APL DERMABOND .7 (GAUZE/BANDAGES/DRESSINGS) ×1
BLADE SURG 15 STRL LF DISP TIS (BLADE) ×1 IMPLANT
BLADE SURG 15 STRL SS (BLADE) ×2
BNDG ADH 1X3 SHEER STRL LF (GAUZE/BANDAGES/DRESSINGS) ×4 IMPLANT
BNDG ADH THN 3X1 STRL LF (GAUZE/BANDAGES/DRESSINGS) ×2
CEMENT KYPHON C01A KIT/MIXER (Cement) ×1 IMPLANT
CEMENT KYPHON CX01A KIT/MIXER (Cement) ×2 IMPLANT
COVER MAYO STAND STRL (DRAPES) ×2 IMPLANT
COVER SURGICAL LIGHT HANDLE (MISCELLANEOUS) ×2 IMPLANT
COVER WAND RF STERILE (DRAPES) IMPLANT
CURETTE EXPRESS SZ2 7MM (INSTRUMENTS) IMPLANT
CURRETTE EXPRESS SZ2 7MM (INSTRUMENTS) ×2
DERMABOND ADVANCED (GAUZE/BANDAGES/DRESSINGS) ×1
DERMABOND ADVANCED .7 DNX12 (GAUZE/BANDAGES/DRESSINGS) ×1 IMPLANT
DRAPE C-ARM 42X72 X-RAY (DRAPES) ×4 IMPLANT
DRAPE INCISE IOBAN 66X45 STRL (DRAPES) ×2 IMPLANT
DRAPE LAPAROTOMY T 102X78X121 (DRAPES) ×2 IMPLANT
DRAPE WARM FLUID 44X44 (DRAPES) ×2 IMPLANT
DURAPREP 26ML APPLICATOR (WOUND CARE) ×2 IMPLANT
GLOVE BIO SURGEON STRL SZ 6.5 (GLOVE) ×2 IMPLANT
GLOVE BIOGEL PI IND STRL 8.5 (GLOVE) IMPLANT
GLOVE BIOGEL PI INDICATOR 8.5 (GLOVE)
GLOVE SS BIOGEL STRL SZ 8.5 (GLOVE) ×1 IMPLANT
GLOVE SUPERSENSE BIOGEL SZ 8.5 (GLOVE) ×1
GLOVE SURG UNDER POLY LF SZ6.5 (GLOVE) ×2 IMPLANT
GOWN STRL REUS W/ TWL LRG LVL3 (GOWN DISPOSABLE) ×2 IMPLANT
GOWN STRL REUS W/TWL 2XL LVL3 (GOWN DISPOSABLE) ×2 IMPLANT
GOWN STRL REUS W/TWL LRG LVL3 (GOWN DISPOSABLE) ×4
KIT BASIN OR (CUSTOM PROCEDURE TRAY) ×2 IMPLANT
KIT OSTEOCOOL BONE ACCESS 8G (MISCELLANEOUS) ×2 IMPLANT
KIT TURNOVER KIT B (KITS) ×2 IMPLANT
NDL SPNL 22GX3.5 QUINCKE BK (NEEDLE) ×1 IMPLANT
NEEDLE HYPO 22GX1.5 SAFETY (NEEDLE) ×2 IMPLANT
NEEDLE SPNL 22GX3.5 QUINCKE BK (NEEDLE) ×2 IMPLANT
NS IRRIG 1000ML POUR BTL (IV SOLUTION) ×2 IMPLANT
PACK SURGICAL SETUP 50X90 (CUSTOM PROCEDURE TRAY) ×2 IMPLANT
PAD ARMBOARD 7.5X6 YLW CONV (MISCELLANEOUS) ×4 IMPLANT
SPONGE LAP 4X18 RFD (DISPOSABLE) ×2 IMPLANT
SUT MNCRL AB 3-0 PS2 18 (SUTURE) ×3 IMPLANT
SYR CONTROL 10ML LL (SYRINGE) ×2 IMPLANT
TAMP BONE INFLATABLE SZ3 15MML (BALLOONS) ×2 IMPLANT
TOWEL GREEN STERILE (TOWEL DISPOSABLE) ×2 IMPLANT
TRAY KYPHOPAK 15/3 ONESTEP 1ST (MISCELLANEOUS) IMPLANT
TRAY KYPHOPAK 20/3 ONESTEP 1ST (MISCELLANEOUS) ×1 IMPLANT
WATER STERILE IRR 1000ML POUR (IV SOLUTION) ×1 IMPLANT

## 2021-03-22 NOTE — H&P (Signed)
Addendum H&P patient continues to have significant back pain with loss in quality of life.  We have spoken with her primary care physician and elected to move forward with IV sedation and local anesthesia.  Patient did not want to postpone surgery in order to obtain pulmonary function tests and move forward with general anesthesia.  I have again talked to her about the length of this operation as it is a 3 level kyphoplasty but we will try and control her pain as best we can.  There is been no change in her clinical exam since her last office visit of 03/20/2021.  All of her questions and concerns were addressed.  She is expressed an understanding and willingness to move forward.  Risks, benefits, alternatives were again reviewed.

## 2021-03-22 NOTE — Op Note (Signed)
OPERATIVE REPORT  DATE OF SURGERY: 03/22/2021  PATIENT NAME:  Natalie Valencia MRN: [id number] DOB: July 29, 1956  PCP: Mechele Claude, MD  PRE-OPERATIVE DIAGNOSIS: Osteoporotic compression fractures L1, 2, and 3  POST-OPERATIVE DIAGNOSIS: Same  PROCEDURE:   Kyphoplasty L1, L2, and L3  SURGEON:  Venita Lick, MD  ANESTHESIA: IV sedation with local anesthesia  EBL: 25 ml   BRIEF HISTORY: Natalie Valencia is a 65 y.o. female who presented to my office with progressive debilitating back pain.  Imaging studies demonstrate an acute/subacute osteoporotic compression fracture of L2, as well as any acute/chronic compression fractures of L1 and L3.  Attempts at conservative management had failed to alleviate her symptoms and her pain and quality of life continues to deteriorate.  I was result we elected to move forward with surgery.  All appropriate risks, benefits and alternatives were discussed and consent was obtained  PROCEDURE DETAILS: Patient was brought into the operating room. After successful induction of IV sedation a Time Out was done. This confirmed all pertinent important data.  Patient was placed prone on the operating table and the back was prepped and draped in standard fashion.  A fluoroscopy machine was brought into the field sterilely in the lateral position, and a second 1 in the AP position.  Identified the L1 vertebral body in both planes and marked out the lateral aspect of the pedicle.  I infiltrated the area with quarter percent Marcaine with Exparel for intraoperative analgesia.  A stab incision was made and a Jamshidi needle was advanced down to the lateral aspect of the pedicle.  I advanced the Jamshidi needle into the pedicle confirming trajectory and position in both the AP and lateral planes.  Once I was nearing the medial wall of the pedicle I confirmed that it was just beyond the posterior wall of the vertebral body in the lateral view.  I advanced into the vertebral body  itself.  Using the same exact technique I cannulated the contralateral pedicle.  I then placed the drill and then sounded the canal to ensure was a solid bony canal.  I then placed the inflatable bone tamp and inflated until I had created a adequate bone void.  I then placed the cement and allowed it to harden.  I repeated this exact same technique at 2 and L3.  Once all 3 vertebral bodies were completed final x-rays were taken demonstrate satisfactory position of the cement mantle at each level with no leak of cement.  The 6 small stab incisions were cleaned and then closed with an interrupted 3-0 Monocryl stitch.  Dermabond and a date were applied and the patient was transferred to the PACU without incident.  The end of the case all needle sponge counts were correct.  There were no adverse intraoperative events.  Venita Lick, MD 03/22/2021 1:54 PM

## 2021-03-22 NOTE — Brief Op Note (Signed)
03/22/2021  1:59 PM  PATIENT:  Natalie Valencia  65 y.o. female  PRE-OPERATIVE DIAGNOSIS:  L1, L2 and L3 compression fractures  POST-OPERATIVE DIAGNOSIS:  L1, L2 and L3 compression fractures  PROCEDURE:  Procedure(s): KYPHOPLASTY LUMBAR ONE, LUMBAR TWO and LUMBAR THREE (N/A)  SURGEON:  Surgeon(s) and Role:    Venita Lick, MD - Primary   ANESTHESIA:   local and IV sedation  EBL:  3 mL   BLOOD ADMINISTERED:none  DRAINS: none   LOCAL MEDICATIONS USED:  MARCAINE    and OTHER Exparel  SPECIMEN:  No Specimen  DISPOSITION OF SPECIMEN:  N/A  COUNTS:  YES  TOURNIQUET:  * No tourniquets in log *  DICTATION: .Dragon Dictation  PLAN OF CARE: Discharge to home after PACU  PATIENT DISPOSITION:  PACU - hemodynamically stable.

## 2021-03-22 NOTE — Anesthesia Procedure Notes (Signed)
Procedure Name: MAC Date/Time: 03/22/2021 12:16 PM Performed by: Michele Rockers, CRNA Pre-anesthesia Checklist: Patient identified, Emergency Drugs available, Suction available and Patient being monitored Patient Re-evaluated:Patient Re-evaluated prior to induction Oxygen Delivery Method: Nasal cannula Preoxygenation: Pre-oxygenation with 100% oxygen Induction Type: IV induction Placement Confirmation: positive ETCO2 Dental Injury: Teeth and Oropharynx as per pre-operative assessment

## 2021-03-22 NOTE — Transfer of Care (Signed)
Immediate Anesthesia Transfer of Care Note  Patient: Natalie Valencia  Procedure(s) Performed: KYPHOPLASTY LUMBAR ONE, LUMBAR TWO and LUMBAR THREE (N/A Back)  Patient Location: PACU  Anesthesia Type:MAC  Level of Consciousness: awake, patient cooperative and responds to stimulation  Airway & Oxygen Therapy: Patient Spontanous Breathing and Patient connected to nasal cannula oxygen  Post-op Assessment: Report given to RN, Post -op Vital signs reviewed and stable and Patient moving all extremities X 4  Post vital signs: Reviewed and stable  Last Vitals:  Vitals Value Taken Time  BP    Temp    Pulse 76 03/22/21 1404  Resp 22 03/22/21 1404  SpO2 96 % 03/22/21 1404  Vitals shown include unvalidated device data.  Last Pain:  Vitals:   03/22/21 1029  TempSrc:   PainSc: 0-No pain         Complications: No complications documented.

## 2021-03-22 NOTE — Anesthesia Procedure Notes (Deleted)
Procedure Name: MAC Date/Time: 03/22/2021 11:11 AM Performed by: Michele Rockers, CRNA Pre-anesthesia Checklist: Patient identified, Emergency Drugs available, Suction available, Timeout performed and Patient being monitored Patient Re-evaluated:Patient Re-evaluated prior to induction Oxygen Delivery Method: Nasal Cannula

## 2021-03-23 ENCOUNTER — Encounter (HOSPITAL_COMMUNITY): Payer: Self-pay | Admitting: Orthopedic Surgery

## 2021-03-23 NOTE — Anesthesia Postprocedure Evaluation (Signed)
Anesthesia Post Note  Patient: Natalie Valencia  Procedure(s) Performed: KYPHOPLASTY LUMBAR ONE, LUMBAR TWO and LUMBAR THREE (N/A Back)     Patient location during evaluation: PACU Anesthesia Type: MAC Level of consciousness: awake and alert Pain management: pain level controlled Vital Signs Assessment: post-procedure vital signs reviewed and stable Respiratory status: spontaneous breathing, nonlabored ventilation, respiratory function stable and patient connected to nasal cannula oxygen Cardiovascular status: stable and blood pressure returned to baseline Postop Assessment: no apparent nausea or vomiting Anesthetic complications: no   No complications documented.  Last Vitals:  Vitals:   03/22/21 1450 03/22/21 1505  BP: (!) 153/72 139/71  Pulse: 66 66  Resp: 13 11  Temp:  (!) 36.4 C  SpO2: 95% 93%    Last Pain:  Vitals:   03/22/21 1505  TempSrc:   PainSc: 0-No pain                 Trell Secrist S

## 2021-04-04 ENCOUNTER — Telehealth: Payer: Self-pay | Admitting: Family Medicine

## 2021-04-04 ENCOUNTER — Other Ambulatory Visit: Payer: Self-pay

## 2021-04-04 NOTE — Telephone Encounter (Signed)
Please arrange for [facility] at [facility]

## 2021-04-04 NOTE — Telephone Encounter (Signed)
Pt is wanting IV infusion therapy for her bone fractures. Has previously discussed this with Dr. Darlyn Read and wants to know if there is anything else that she needs to do because she was told it would have to be set up in [place] and she has not been contacted to set up an appt.

## 2021-04-05 NOTE — Telephone Encounter (Signed)
Form on your desk to sing once finished we will fax to [facility]

## 2021-04-05 NOTE — Telephone Encounter (Signed)
Thank you :)

## 2021-04-14 ENCOUNTER — Telehealth: Payer: Self-pay | Admitting: Family Medicine

## 2021-04-14 NOTE — Telephone Encounter (Signed)
Natalie Valencia has not received ppw for pt to get IV fusions for bones. She says she spoke with Eber Jones at [facility] and she gave her a fax number to have the ppw faxed to. Fax [phone]  Please call back when done

## 2021-04-14 NOTE — Telephone Encounter (Signed)
LEFT DETAILED MESSAGE [facility] [employer] RESENT TO FAX NUMBER THAT WAS GIVEN

## 2021-04-26 ENCOUNTER — Other Ambulatory Visit: Payer: Self-pay

## 2021-04-26 ENCOUNTER — Encounter (HOSPITAL_COMMUNITY)
Admission: RE | Admit: 2021-04-26 | Discharge: 2021-04-26 | Disposition: A | Discharge status: Home / Self Care | Payer: BC Managed Care – PPO | Source: Ambulatory Visit | Attending: Family Medicine | Admitting: Family Medicine

## 2021-04-26 DIAGNOSIS — M810 Age-related osteoporosis without current pathological fracture: Secondary | ICD-10-CM | POA: Insufficient documentation

## 2021-04-26 MED ORDER — ZOLEDRONIC ACID 5 MG/100ML IV SOLN
5.0000 mg | Freq: Once | INTRAVENOUS | Status: AC
  Administered 2021-04-26: 5 mg via INTRAVENOUS

## 2021-04-26 MED ORDER — ZOLEDRONIC ACID 5 MG/100ML IV SOLN
INTRAVENOUS | Status: AC
  Filled 2021-04-26: qty 100

## 2021-04-26 MED ORDER — SODIUM CHLORIDE 0.9 % IV SOLN
Freq: Once | INTRAVENOUS | Status: AC
  Administered 2021-04-26: 250 mL via INTRAVENOUS

## 2021-05-02 DIAGNOSIS — Z4789 Encounter for other orthopedic aftercare: Secondary | ICD-10-CM | POA: Diagnosis not present

## 2021-05-05 ENCOUNTER — Other Ambulatory Visit: Payer: Self-pay

## 2021-05-05 ENCOUNTER — Encounter: Payer: Self-pay | Admitting: Physical Therapy

## 2021-05-05 ENCOUNTER — Ambulatory Visit: Payer: BC Managed Care – PPO | Attending: Orthopedic Surgery | Admitting: Physical Therapy

## 2021-05-05 DIAGNOSIS — R293 Abnormal posture: Secondary | ICD-10-CM | POA: Insufficient documentation

## 2021-05-05 DIAGNOSIS — M545 Low back pain, unspecified: Secondary | ICD-10-CM | POA: Diagnosis not present

## 2021-05-05 DIAGNOSIS — G8929 Other chronic pain: Secondary | ICD-10-CM | POA: Diagnosis not present

## 2021-05-05 NOTE — Therapy (Signed)
[facility] [address] [place], [place], [address] Phone: [phone]   Fax:  [phone]  Physical Therapy Evaluation  Patient Details  Name: Natalie Valencia MRN: [id number] Date of Birth: 11-22-1955 Referring Provider (PT): Venita Lick MD   Encounter Date: 05/05/2021   PT End of Session - 05/05/21 1409     Visit Number 1    Number of Visits 3    Date for PT Re-Evaluation 05/19/21    PT Start Time 0901    PT Stop Time 0949    PT Time Calculation (min) 48 min    Activity Tolerance Patient tolerated treatment well    Behavior During Therapy [facility] for tasks assessed/performed             Past Medical History:  Diagnosis Date   Allergy    Asthma    Back pain    GERD (gastroesophageal reflux disease)    Hyperlipidemia    borderline diet controlled   Hypertension    situational no meds   Hypothyroidism    Osteopenia    Thyroid disease     Past Surgical History:  Procedure Laterality Date   ABDOMINAL HYSTERECTOMY  2001   COLONOSCOPY     DILATION AND CURETTAGE OF UTERUS     KYPHOPLASTY N/A 03/22/2021   Procedure: KYPHOPLASTY LUMBAR ONE, LUMBAR TWO and LUMBAR THREE;  Surgeon: Venita Lick, MD;  Location: [facility] OR;  Service: Orthopedics;  Laterality: N/A;   NASAL SINUS SURGERY     UPPER GASTROINTESTINAL ENDOSCOPY      There were no vitals filed for this visit.    Subjective Assessment - 05/05/21 1259     Subjective COVID-19 screen performed prior to patient entering clinic.  The patient presents to the clinic today s/p lumbar kyphoplasty (L1 to L3).  She had initially injured her back after coughing on 02/02/21.  Today, she has no pain complaints but does on occasions experience pain below her surgical site. She would like to have a couple of visits to have a HEP established.    Pertinent History OP, GERD, HTN.    Patient Stated Goals Establish HEP.    Currently in Pain? No/denies                [facility] PT  Assessment - 05/05/21 0001       Assessment   Medical Diagnosis Lumbar spine pain.    Referring Provider (PT) Venita Lick MD    Onset Date/Surgical Date 02/02/21      Precautions   Precaution Comments No spinal loading.      Restrictions   Weight Bearing Restrictions No      Balance Screen   Has the patient fallen in the past 6 months No    Has the patient had a decrease in activity level because of a fear of falling?  No    Is the patient reluctant to leave their home because of a fear of falling?  No      Home Environment   Living Environment Private residence      Prior Function   Level of Independence Independent      Posture/Postural Control   Posture/Postural Control Postural limitations    Postural Limitations Rounded Shoulders;Forward head      Deep Tendon Reflexes   DTR Assessment Site Achilles;Patella    Patella DTR 2+    Achilles DTR 2+      ROM / Strength   AROM / PROM / Strength AROM;Strength  AROM   Overall AROM Comments In supine:  Bilateral hip flexion is WNL.      Strength   Overall Strength Comments Bilateral knee and ankel strength is normal.      Palpation   Palpation comment Tender to palpation across her lower lumbar region below surgical site.      Ambulation/Gait   Gait Comments WNL.                        Objective measurements completed on examination: See above findings.       [facility] Adult PT Treatment/Exercise - 05/05/21 0001       Modalities   Modalities Electrical Stimulation;Moist Heat      Moist Heat Therapy   Number Minutes Moist Heat 20 Minutes    Moist Heat Location Lumbar Spine      Electrical Stimulation   Electrical Stimulation Location Lower lumbar.    Electrical Stimulation Action IFC at 80-150 Hz.    Electrical Stimulation Parameters 40% scan x 20 minutes.    Electrical Stimulation Goals Pain                                 Plan - 05/05/21 0941     Clinical  Impression Statement The patient presents to [facility] s/p L1 to L3 kyphoplasty that was performed on 03/22/21.  She is doing very well and is interested in just a couple to treatments to establish a HEP.  Her pain when it occurs in actually below her surgical site.  Her LE DTR's are normal as is her bilateral hip and knee strength.  Hip flexion bilaterally in supine is normal.  Patient will benefit from skilled physical therapy intervention to address pain and deficits.    Personal Factors and Comorbidities Comorbidity 1;Comorbidity 2    PT Next Visit Plan No spinal loading.  Review S and DKTC and hip bridges.  Instruct in body mechanics for ADL's.  Abdominal bracing.  Scapular retraction.    Consulted and Agree with Plan of Care Patient             Patient will benefit from skilled therapeutic intervention in order to improve the following deficits and impairments:     Visit Diagnosis: Chronic bilateral low back pain without sciatica  Abnormal posture - Plan: PT plan of care cert/re-cert     Problem List Patient Active Problem List   Diagnosis Date Noted   Age-related osteoporosis without current pathological fracture 12/03/2019   BMI 25.0-25.9,adult 08/28/2019   Gastritis and gastroduodenitis 05/18/2016   Allergic rhinitis 05/18/2016   Asthma without acute exacerbation 08/05/2015   Essential hypertension, benign 02/17/2013   Hyperlipidemia with target LDL less than 100 02/17/2013   Hypothyroidism 02/17/2013    Taysom Glymph, [place] MPT 05/05/2021, 3:08 PM  [facility] [address] [place], [place], [address] Phone: [phone]   Fax:  [phone]  Name: Natalie Valencia MRN: [id number] Date of Birth: 1956-10-15

## 2021-05-12 ENCOUNTER — Other Ambulatory Visit: Payer: Self-pay | Admitting: *Deleted

## 2021-05-12 ENCOUNTER — Other Ambulatory Visit: Payer: Self-pay

## 2021-05-12 ENCOUNTER — Ambulatory Visit: Payer: BC Managed Care – PPO | Admitting: *Deleted

## 2021-05-12 DIAGNOSIS — G8929 Other chronic pain: Secondary | ICD-10-CM | POA: Diagnosis not present

## 2021-05-12 DIAGNOSIS — R293 Abnormal posture: Secondary | ICD-10-CM

## 2021-05-12 DIAGNOSIS — M545 Low back pain, unspecified: Secondary | ICD-10-CM | POA: Diagnosis not present

## 2021-05-12 NOTE — Therapy (Signed)
[facility] [address] [place], [place], [address] Phone: [phone]   Fax:  [phone]  Physical Therapy Treatment  Patient Details  Name: Natalie Valencia MRN: [id number] Date of Birth: 06-Mar-1956 Referring Provider (PT): Venita Lick MD   Encounter Date: 05/12/2021   PT End of Session - 05/12/21 1037     Visit Number 2    Number of Visits 3    Date for PT Re-Evaluation 05/19/21    PT Start Time 1030    PT Stop Time 1119    PT Time Calculation (min) 49 min             Past Medical History:  Diagnosis Date   Allergy    Asthma    Back pain    GERD (gastroesophageal reflux disease)    Hyperlipidemia    borderline diet controlled   Hypertension    situational no meds   Hypothyroidism    Osteopenia    Thyroid disease     Past Surgical History:  Procedure Laterality Date   ABDOMINAL HYSTERECTOMY  2001   COLONOSCOPY     DILATION AND CURETTAGE OF UTERUS     KYPHOPLASTY N/A 03/22/2021   Procedure: KYPHOPLASTY LUMBAR ONE, LUMBAR TWO and LUMBAR THREE;  Surgeon: Venita Lick, MD;  Location: [facility];  Service: Orthopedics;  Laterality: N/A;   NASAL SINUS SURGERY     UPPER GASTROINTESTINAL ENDOSCOPY      There were no vitals filed for this visit.   Subjective Assessment - 05/12/21 1033     Subjective COVID-19 screen performed prior to patient entering clinic.    Pertinent History OP, GERD, HTN.    Currently in Pain? Yes    Pain Score 1     Pain Location Back    Pain Orientation Lower;Mid                               [facility] Adult PT Treatment/Exercise - 05/12/21 0001       Self-Care   Self-Care --   Instructed in home TENS use to LB     Therapeutic Activites    Therapeutic Activities ADL's    ADL's AB bracing with ADL posture, Log roll in/out  bed      Lumbar Exercises: Seated   Sit to Stand 15 reps   with AB bracing     Lumbar Exercises: Supine   Ab Set 10 reps;5 seconds    Bent  Knee Raise 20 reps;5 seconds   with AB brace     Lumbar Exercises: Sidelying   Hip Abduction Both;10 reps   hold 10 secs     Lumbar Exercises: Quadruped   Single Arm Raise 10 reps;5 seconds    Straight Leg Raise 10 reps;5 seconds                                Plan - 05/12/21 1136     Clinical Impression Statement Pt arrived today doing well and with Home TENS unit. Rx focused on AB bracing for core exs a swell as for ADL's and during transitional movements. Chair squat with focus on hip hinging and glute activation. Pt instructed in TENS use for home. Handout for HEP given    Personal Factors and Comorbidities Comorbidity 1;Comorbidity 2    PT Treatment/Interventions ADLs/Self Care Home Management;Therapeutic activities;Therapeutic exercise;Electrical Stimulation    PT  Next Visit Plan No spinal loading.  Review S and DKTC and hip bridges.  Instruct in body mechanics for ADL's.  Abdominal bracing.  Scapular retraction.             Patient will benefit from skilled therapeutic intervention in order to improve the following deficits and impairments:     Visit Diagnosis: Chronic bilateral low back pain without sciatica  Abnormal posture     Problem List Patient Active Problem List   Diagnosis Date Noted   Age-related osteoporosis without current pathological fracture 12/03/2019   BMI 25.0-25.9,adult 08/28/2019   Gastritis and gastroduodenitis 05/18/2016   Allergic rhinitis 05/18/2016   Asthma without acute exacerbation 08/05/2015   Essential hypertension, benign 02/17/2013   Hyperlipidemia with target LDL less than 100 02/17/2013   Hypothyroidism 02/17/2013    Dimple Bastyr,CHRIS, PTA 05/12/2021, 11:42 AM  [facility] [address] [place], [place], [address] Phone: [phone]   Fax:  [phone]  Name: Amari Burnsworth MRN: [id number] Date of Birth: [birth year]

## 2021-05-19 ENCOUNTER — Ambulatory Visit: Payer: BC Managed Care – PPO | Admitting: *Deleted

## 2021-05-19 ENCOUNTER — Other Ambulatory Visit: Payer: Self-pay

## 2021-05-19 DIAGNOSIS — G8929 Other chronic pain: Secondary | ICD-10-CM

## 2021-05-19 DIAGNOSIS — M545 Low back pain, unspecified: Secondary | ICD-10-CM

## 2021-05-19 DIAGNOSIS — R293 Abnormal posture: Secondary | ICD-10-CM

## 2021-05-19 NOTE — Therapy (Signed)
[facility] [place], [place], [address] Phone: [phone]   Fax:  [phone]  Physical Therapy Treatment  Patient Details  Name: Natalie Valencia MRN: [id number] Date of Birth: 07/09/1956 Referring Provider (PT): Melina Schools MD   Encounter Date: 05/19/2021   PT End of Session - 05/19/21 1034     Visit Number 3    Number of Visits 3    Date for PT Re-Evaluation 05/19/21    PT Start Time 1030    PT Stop Time 1120    PT Time Calculation (min) 50 min             Past Medical History:  Diagnosis Date   Allergy    Asthma    Back pain    GERD (gastroesophageal reflux disease)    Hyperlipidemia    borderline diet controlled   Hypertension    situational no meds   Hypothyroidism    Osteopenia    Thyroid disease     Past Surgical History:  Procedure Laterality Date   ABDOMINAL HYSTERECTOMY  2001   COLONOSCOPY     DILATION AND CURETTAGE OF UTERUS     KYPHOPLASTY N/A 03/22/2021   Procedure: KYPHOPLASTY LUMBAR ONE, LUMBAR TWO and LUMBAR THREE;  Surgeon: Melina Schools, MD;  Location: [facility];  Service: Orthopedics;  Laterality: N/A;   NASAL SINUS SURGERY     UPPER GASTROINTESTINAL ENDOSCOPY      There were no vitals filed for this visit.   Subjective Assessment - 05/19/21 1032     Subjective COVID-19 screen performed prior to patient entering clinic., Pain today 1/10. EX with leg raise was challenging    Pertinent History OP, GERD, HTN.    Patient Stated Goals Establish HEP.    Currently in Pain? Yes    Pain Score 1     Pain Location Back    Pain Orientation Lower;Mid    Pain Descriptors / Indicators Aching;Sore                               [facility] Adult PT Treatment/Exercise - 05/19/21 0001       Therapeutic Activites    Therapeutic Activities ADL's    ADL's AB bracing with ADL posture, Log roll in/out  bed      Lumbar Exercises: Standing   Other Standing Lumbar Exercises  Standing modified bird dog with arm raise and then leg raise      Lumbar Exercises: Seated   Sit to Stand 15 reps   with AB bracing     Lumbar Exercises: Supine   Ab Set 10 reps;5 seconds    Bent Knee Raise 20 reps;5 seconds   with AB brace     Lumbar Exercises: Sidelying   Hip Abduction Both;10 reps   hold 10 secs                        PT Long Term Goals - 05/19/21 1108       PT LONG TERM GOAL #1   Title Independent with HEP.    Time 2    Period Weeks    Status Achieved                   Plan - 05/19/21 1242     Clinical Impression Statement Pt arrived today doing very well with low pain levels in her LB. Rx focused on HEP  for core strengthening exs as well as spinal hygiene and good movement patterns. Modified Bird dog exs standing at counter were added due to QP arm and leg raises were to challenging at this time. DC to HEP. LTG met    Personal Factors and Comorbidities Comorbidity 1;Comorbidity 2    PT Treatment/Interventions ADLs/Self Care Home Management;Therapeutic activities;Therapeutic exercise;Electrical Stimulation    PT Next Visit Plan DC to HEP    Consulted and Agree with Plan of Care Patient             Patient will benefit from skilled therapeutic intervention in order to improve the following deficits and impairments:     Visit Diagnosis: Chronic bilateral low back pain without sciatica  Abnormal posture     Problem List Patient Active Problem List   Diagnosis Date Noted   Age-related osteoporosis without current pathological fracture 12/03/2019   BMI 25.0-25.9,adult 08/28/2019   Gastritis and gastroduodenitis 05/18/2016   Allergic rhinitis 05/18/2016   Asthma without acute exacerbation 08/05/2015   Essential hypertension, benign 02/17/2013   Hyperlipidemia with target LDL less than 100 02/17/2013   Hypothyroidism 02/17/2013    Sava Proby,CHRIS, PTA 05/19/2021, 12:52 PM  [facility] [address] [place], [place], [address] Phone: [phone]   Fax:  [phone]  Name: Natalie Valencia MRN: [id number] Date of Birth: [birth year]  PHYSICAL THERAPY DISCHARGE SUMMARY  Visits from Start of Care: 3.  Current functional level related to goals / functional outcomes: See above.   Remaining deficits: Goal met.   Education / Equipment: HEP.       Mali Applegate MPT  Patient agrees to discharge. Patient goals were met. Patient is being discharged due to being pleased with the current functional level.

## 2021-05-28 DIAGNOSIS — J329 Chronic sinusitis, unspecified: Secondary | ICD-10-CM | POA: Diagnosis not present

## 2021-05-28 DIAGNOSIS — Z6824 Body mass index (BMI) 24.0-24.9, adult: Secondary | ICD-10-CM | POA: Diagnosis not present

## 2021-06-14 DIAGNOSIS — J329 Chronic sinusitis, unspecified: Secondary | ICD-10-CM | POA: Diagnosis not present

## 2021-06-14 DIAGNOSIS — J300 Vasomotor rhinitis: Secondary | ICD-10-CM | POA: Diagnosis not present

## 2021-06-14 DIAGNOSIS — J4551 Severe persistent asthma with (acute) exacerbation: Secondary | ICD-10-CM | POA: Diagnosis not present

## 2021-07-04 DIAGNOSIS — Z4889 Encounter for other specified surgical aftercare: Secondary | ICD-10-CM | POA: Diagnosis not present

## 2021-07-06 ENCOUNTER — Telehealth: Payer: Self-pay | Admitting: Family Medicine

## 2021-07-06 NOTE — Telephone Encounter (Signed)
Called pt to let her know that we had a surgical clearance form in the drawer for her. Please shred if she calls back and no longer needs.

## 2021-07-20 ENCOUNTER — Ambulatory Visit: Payer: BC Managed Care – PPO | Admitting: Family Medicine

## 2021-07-20 ENCOUNTER — Encounter: Payer: Self-pay | Admitting: Family Medicine

## 2021-07-20 ENCOUNTER — Other Ambulatory Visit: Payer: Self-pay

## 2021-07-20 VITALS — Temp 98.1°F | Ht 67.0 in | Wt 165.6 lb

## 2021-07-20 DIAGNOSIS — E785 Hyperlipidemia, unspecified: Secondary | ICD-10-CM | POA: Diagnosis not present

## 2021-07-20 DIAGNOSIS — E039 Hypothyroidism, unspecified: Secondary | ICD-10-CM | POA: Diagnosis not present

## 2021-07-20 DIAGNOSIS — K2970 Gastritis, unspecified, without bleeding: Secondary | ICD-10-CM

## 2021-07-20 DIAGNOSIS — I10 Essential (primary) hypertension: Secondary | ICD-10-CM | POA: Diagnosis not present

## 2021-07-20 DIAGNOSIS — K2990 Gastroduodenitis, unspecified, without bleeding: Secondary | ICD-10-CM

## 2021-07-20 MED ORDER — BUDESONIDE-FORMOTEROL FUMARATE 160-4.5 MCG/ACT IN AERO: 2.0000 | INHALATION_SPRAY | Freq: Two times a day (BID) | RESPIRATORY_TRACT | 3 refills | Status: DC

## 2021-07-20 MED ORDER — PANTOPRAZOLE SODIUM 40 MG PO TBEC: 40.0000 mg | DELAYED_RELEASE_TABLET | Freq: Every day | ORAL | 3 refills | Status: DC

## 2021-07-20 NOTE — Progress Notes (Signed)
.  Subjective:  Patient ID: Natalie Valencia, female    DOB: May 03, 1956  Age: 65 y.o. MRN: [id number]  CC: Medical Management of Chronic Issues   HPI Natalie Valencia presents for  follow-up on  thyroid. The patient has a history of hypothyroidism for many years. It has been stable recently. Pt. denies any change in  voice, loss of hair, heat or cold intolerance. Energy level has been adequate to good. Patient denies constipation and diarrhea. No myxedema. Medication is as noted below. Verified that pt is taking it daily on an empty stomach. Well tolerated.  Patient in for follow-up of GERD. Currently asymptomatic taking  PPI daily. There is no chest pain or heartburn. No hematemesis and no melena. No dysphagia or choking. Onset is remote. Progression is stable. Complicating factors, none.  Recent back surgery 4 mos ago. Still tender, but pain is better. Had Kyphoplasty at L1,2&3 due to compression fx of the three vertebbrae. Noted on DEXA from 2021 to have osreopenia at spine, osteoporosis at hip. USing Evista daily  Depression screen [facility] 2/9 07/20/2021 02/27/2021 02/21/2021  Decreased Interest 0 0 0  Down, Depressed, Hopeless 0 0 0  PHQ - 2 Score 0 0 0  Altered sleeping - - -  Tired, decreased energy - - -  Change in appetite - - -  Feeling bad or failure about yourself  - - -  Trouble concentrating - - -  Moving slowly or fidgety/restless - - -  Suicidal thoughts - - -  PHQ-9 Score - - -    History Natalie Valencia has a past medical history of Allergy, Asthma, Back pain, GERD (gastroesophageal reflux disease), Hyperlipidemia, Hypertension, Hypothyroidism, Osteopenia, and Thyroid disease.   She has a past surgical history that includes Abdominal hysterectomy (2001); Nasal sinus surgery; Colonoscopy; Upper gastrointestinal endoscopy; Dilation and curettage of uterus; and Kyphoplasty (N/A, 03/22/2021).   Her family history includes Alcoholism in her maternal grandfather; Cancer in her mother; Diabetes  in her father; Stroke in her paternal grandmother.She reports that she has never smoked. She has never used smokeless tobacco. She reports that she does not drink alcohol and does not use drugs.    ROS Review of Systems  Constitutional: Negative.   HENT: Negative.    Eyes:  Negative for visual disturbance.  Respiratory:  Negative for shortness of breath.   Cardiovascular:  Negative for chest pain.  Gastrointestinal:  Negative for abdominal pain.  Musculoskeletal:  Positive for arthralgias.   Objective:  Temp 98.1 F (36.7 C)   Ht 5' 7"  (1.702 m)   Wt 165 lb 9.6 oz (75.1 kg)   BMI 25.94 kg/m   BP Readings from Last 3 Encounters:  04/26/21 (!) 148/83  03/22/21 139/71  03/21/21 (!) 151/61    Wt Readings from Last 3 Encounters:  07/20/21 165 lb 9.6 oz (75.1 kg)  03/22/21 160 lb (72.6 kg)  03/21/21 159 lb 6.4 oz (72.3 kg)     Physical Exam Constitutional:      General: She is not in acute distress.    Appearance: She is well-developed.  Cardiovascular:     Rate and Rhythm: Normal rate and regular rhythm.  Pulmonary:     Breath sounds: Normal breath sounds.  Musculoskeletal:        General: Normal range of motion.  Skin:    General: Skin is warm and dry.  Neurological:     Mental Status: She is alert and oriented to person, place, and time.  Assessment & Plan:   Natalie Valencia was seen today for medical management of chronic issues.  Diagnoses and all orders for this visit:  Essential hypertension, benign -     CBC with Differential/Platelet -     CMP14+EGFR  Acquired hypothyroidism -     TSH + free T4  Hyperlipidemia with target LDL less than 100 -     Lipid panel  Gastritis and gastroduodenitis -     pantoprazole (PROTONIX) 40 MG tablet; Take 1 tablet (40 mg total) by mouth daily.  Other orders -     budesonide-formoterol (SYMBICORT) 160-4.5 MCG/ACT inhaler; Inhale 2 puffs into the lungs in the morning and at bedtime.      I am having Natalie Valencia maintain her AMBULATORY NON FORMULARY MEDICATION, albuterol, guaiFENesin, raloxifene, rosuvastatin, levothyroxine, benzonatate, oxymetazoline, pseudoephedrine-acetaminophen, acetaminophen, vitamin B-12, calcium-vitamin D, docusate sodium, ibuprofen, budesonide-formoterol, and pantoprazole. We will continue to administer zoledronic acid.  Allergies as of 07/20/2021   No Known Allergies      Medication List        Accurate as of July 20, 2021 11:59 PM. If you have any questions, ask your nurse or doctor.          acetaminophen 500 MG tablet Commonly known as: TYLENOL Take 1,000 mg by mouth every 8 (eight) hours as needed for moderate pain.   albuterol 108 (90 Base) MCG/ACT inhaler Commonly known as: VENTOLIN HFA Inhale 2 puffs into the lungs every 4 (four) hours as needed for wheezing or shortness of breath.   AMBULATORY NON FORMULARY MEDICATION Allergy Shots Take 1 injection every 2 weeks   benzonatate 100 MG capsule Commonly known as: TESSALON Take 100 mg by mouth 3 (three) times daily as needed for cough.   budesonide-formoterol 160-4.5 MCG/ACT inhaler Commonly known as: SYMBICORT Inhale 2 puffs into the lungs in the morning and at bedtime.   calcium-vitamin D 500-200 MG-UNIT tablet Commonly known as: OSCAL WITH D Take 2 tablets by mouth.   docusate sodium 100 MG capsule Commonly known as: COLACE Take 100 mg by mouth as needed for mild constipation.   guaiFENesin 600 MG 12 hr tablet Commonly known as: MUCINEX Take 600 mg by mouth 2 (two) times daily as needed for cough or to loosen phlegm.   ibuprofen 100 MG tablet Commonly known as: ADVIL Take 200 mg by mouth as needed for fever.   levothyroxine 50 MCG tablet Commonly known as: SYNTHROID Take 1 tablet by mouth once daily What changed: when to take this   oxymetazoline 0.05 % nasal spray Commonly known as: AFRIN Place 1 spray into both nostrils 2 (two) times daily as needed for congestion.    pantoprazole 40 MG tablet Commonly known as: PROTONIX Take 1 tablet (40 mg total) by mouth daily.   pseudoephedrine-acetaminophen 30-500 MG Tabs tablet Commonly known as: TYLENOL SINUS Take 1 tablet by mouth every 4 (four) hours as needed (congestion).   raloxifene 60 MG tablet Commonly known as: Evista Take 1 tablet (60 mg total) by mouth daily. For bone health   rosuvastatin 5 MG tablet Commonly known as: Crestor Take 1 tablet (5 mg total) by mouth daily. For cholesterol   vitamin B-12 500 MCG tablet Commonly known as: CYANOCOBALAMIN Take 1,000 mcg by mouth daily.         Follow-up: Return in about 6 months (around 01/17/2022).  Natalie Valencia, M.D.

## 2021-07-25 ENCOUNTER — Encounter: Payer: Self-pay | Admitting: Family Medicine

## 2021-07-27 ENCOUNTER — Other Ambulatory Visit: Payer: BC Managed Care – PPO

## 2021-07-27 ENCOUNTER — Other Ambulatory Visit: Payer: Self-pay

## 2021-07-27 ENCOUNTER — Ambulatory Visit (INDEPENDENT_AMBULATORY_CARE_PROVIDER_SITE_OTHER): Payer: BC Managed Care – PPO

## 2021-07-27 DIAGNOSIS — J3489 Other specified disorders of nose and nasal sinuses: Secondary | ICD-10-CM | POA: Diagnosis not present

## 2021-07-27 DIAGNOSIS — Z23 Encounter for immunization: Secondary | ICD-10-CM

## 2021-07-27 DIAGNOSIS — I10 Essential (primary) hypertension: Secondary | ICD-10-CM | POA: Diagnosis not present

## 2021-07-27 DIAGNOSIS — E039 Hypothyroidism, unspecified: Secondary | ICD-10-CM | POA: Diagnosis not present

## 2021-07-27 DIAGNOSIS — J329 Chronic sinusitis, unspecified: Secondary | ICD-10-CM | POA: Diagnosis not present

## 2021-07-27 DIAGNOSIS — E785 Hyperlipidemia, unspecified: Secondary | ICD-10-CM | POA: Diagnosis not present

## 2021-07-27 DIAGNOSIS — Z6825 Body mass index (BMI) 25.0-25.9, adult: Secondary | ICD-10-CM | POA: Diagnosis not present

## 2021-07-28 LAB — CMP14+EGFR
ALT: 9 IU/L (ref 0–32)
AST: 15 IU/L (ref 0–40)
Albumin/Globulin Ratio: 2 (ref 1.2–2.2)
Albumin: 4.5 g/dL (ref 3.8–4.8)
Alkaline Phosphatase: 55 IU/L (ref 44–121)
BUN/Creatinine Ratio: 16 (ref 12–28)
BUN: 13 mg/dL (ref 8–27)
Bilirubin Total: 0.3 mg/dL (ref 0.0–1.2)
CO2: 23 mmol/L (ref 20–29)
Calcium: 9.5 mg/dL (ref 8.7–10.3)
Chloride: 104 mmol/L (ref 96–106)
Creatinine, Ser: 0.81 mg/dL (ref 0.57–1.00)
Globulin, Total: 2.2 g/dL (ref 1.5–4.5)
Glucose: 99 mg/dL (ref 70–99)
Potassium: 4.4 mmol/L (ref 3.5–5.2)
Sodium: 141 mmol/L (ref 134–144)
Total Protein: 6.7 g/dL (ref 6.0–8.5)
eGFR: 81 mL/min/{1.73_m2} (ref >59)

## 2021-07-28 LAB — CBC WITH DIFFERENTIAL/PLATELET
Basophils Absolute: 0.1 10*3/uL (ref 0.0–0.2)
Basos: 2 %
EOS (ABSOLUTE): 2.2 10*3/uL — ABNORMAL HIGH (ref 0.0–0.4)
Eos: 31 %
Hematocrit: 43.5 % (ref 34.0–46.6)
Hemoglobin: 14 g/dL (ref 11.1–15.9)
Immature Grans (Abs): 0 10*3/uL (ref 0.0–0.1)
Immature Granulocytes: 0 %
Lymphocytes Absolute: 1.9 10*3/uL (ref 0.7–3.1)
Lymphs: 27 %
MCH: 28.4 pg (ref 26.6–33.0)
MCHC: 32.2 g/dL (ref 31.5–35.7)
MCV: 88 fL (ref 79–97)
Monocytes Absolute: 0.5 10*3/uL (ref 0.1–0.9)
Monocytes: 8 %
Neutrophils Absolute: 2.3 10*3/uL (ref 1.4–7.0)
Neutrophils: 32 %
Platelets: 279 10*3/uL (ref 150–450)
RBC: 4.93 x10E6/uL (ref 3.77–5.28)
RDW: 12.6 % (ref 11.7–15.4)
WBC: 7.1 10*3/uL (ref 3.4–10.8)

## 2021-07-28 LAB — LIPID PANEL
Chol/HDL Ratio: 2.1 ratio (ref 0.0–4.4)
Cholesterol, Total: 181 mg/dL (ref 100–199)
HDL: 87 mg/dL (ref >39)
LDL Chol Calc (NIH): 77 mg/dL (ref 0–99)
Triglycerides: 97 mg/dL (ref 0–149)
VLDL Cholesterol Cal: 17 mg/dL (ref 5–40)

## 2021-07-28 LAB — TSH+FREE T4
Free T4: 1.5 ng/dL (ref 0.82–1.77)
TSH: 2.8 u[IU]/mL (ref 0.450–4.500)

## 2021-07-28 NOTE — Progress Notes (Signed)
Hello Sharan,  Your lab result is normal and/or stable.Some minor variations that are not significant are commonly marked abnormal, but do not represent any medical problem for you.  Best regards, Geniece Akers, M.D.

## 2021-09-05 DIAGNOSIS — Z1231 Encounter for screening mammogram for malignant neoplasm of breast: Secondary | ICD-10-CM | POA: Diagnosis not present

## 2021-09-29 ENCOUNTER — Other Ambulatory Visit: Payer: Self-pay | Admitting: Family Medicine

## 2021-09-29 DIAGNOSIS — K2970 Gastritis, unspecified, without bleeding: Secondary | ICD-10-CM

## 2021-10-12 DIAGNOSIS — B078 Other viral warts: Secondary | ICD-10-CM | POA: Diagnosis not present

## 2021-10-12 DIAGNOSIS — X32XXXA Exposure to sunlight, initial encounter: Secondary | ICD-10-CM | POA: Diagnosis not present

## 2021-10-12 DIAGNOSIS — L570 Actinic keratosis: Secondary | ICD-10-CM | POA: Diagnosis not present

## 2021-12-31 DIAGNOSIS — S9001XA Contusion of right ankle, initial encounter: Secondary | ICD-10-CM | POA: Diagnosis not present

## 2021-12-31 DIAGNOSIS — M7989 Other specified soft tissue disorders: Secondary | ICD-10-CM | POA: Diagnosis not present

## 2021-12-31 DIAGNOSIS — R600 Localized edema: Secondary | ICD-10-CM | POA: Diagnosis not present

## 2021-12-31 DIAGNOSIS — M25561 Pain in right knee: Secondary | ICD-10-CM | POA: Diagnosis not present

## 2021-12-31 DIAGNOSIS — S9031XA Contusion of right foot, initial encounter: Secondary | ICD-10-CM | POA: Diagnosis not present

## 2021-12-31 DIAGNOSIS — W130XXA Fall from, out of or through balcony, initial encounter: Secondary | ICD-10-CM | POA: Diagnosis not present

## 2022-01-17 ENCOUNTER — Encounter: Payer: Self-pay | Admitting: Family Medicine

## 2022-01-17 ENCOUNTER — Ambulatory Visit (INDEPENDENT_AMBULATORY_CARE_PROVIDER_SITE_OTHER): Payer: Medicare Other | Admitting: Family Medicine

## 2022-01-17 ENCOUNTER — Ambulatory Visit (INDEPENDENT_AMBULATORY_CARE_PROVIDER_SITE_OTHER): Payer: Medicare Other

## 2022-01-17 VITALS — BP 132/69 | HR 74 | Temp 97.2°F | Ht 67.0 in | Wt 161.4 lb

## 2022-01-17 DIAGNOSIS — K2970 Gastritis, unspecified, without bleeding: Secondary | ICD-10-CM

## 2022-01-17 DIAGNOSIS — I10 Essential (primary) hypertension: Secondary | ICD-10-CM

## 2022-01-17 DIAGNOSIS — R600 Localized edema: Secondary | ICD-10-CM | POA: Diagnosis not present

## 2022-01-17 DIAGNOSIS — K2990 Gastroduodenitis, unspecified, without bleeding: Secondary | ICD-10-CM

## 2022-01-17 DIAGNOSIS — E039 Hypothyroidism, unspecified: Secondary | ICD-10-CM

## 2022-01-17 DIAGNOSIS — E785 Hyperlipidemia, unspecified: Secondary | ICD-10-CM | POA: Diagnosis not present

## 2022-01-17 DIAGNOSIS — S93601A Unspecified sprain of right foot, initial encounter: Secondary | ICD-10-CM

## 2022-01-17 DIAGNOSIS — M810 Age-related osteoporosis without current pathological fracture: Secondary | ICD-10-CM

## 2022-01-17 MED ORDER — RALOXIFENE HCL 60 MG PO TABS: 60.0000 mg | ORAL_TABLET | Freq: Every day | ORAL | 3 refills | Status: DC

## 2022-01-17 MED ORDER — FEXOFENADINE-PSEUDOEPHED ER 180-240 MG PO TB24: 1.0000 | ORAL_TABLET | Freq: Every evening | ORAL | 11 refills | Status: DC

## 2022-01-17 MED ORDER — ROSUVASTATIN CALCIUM 5 MG PO TABS: 5.0000 mg | ORAL_TABLET | Freq: Every day | ORAL | 3 refills | Status: DC

## 2022-01-17 MED ORDER — LEVOTHYROXINE SODIUM 50 MCG PO TABS: 50.0000 ug | ORAL_TABLET | Freq: Every day | ORAL | 3 refills | Status: DC

## 2022-01-17 MED ORDER — FLUTICASONE FUROATE-VILANTEROL 200-25 MCG/ACT IN AEPB: 1.0000 | INHALATION_SPRAY | Freq: Every day | RESPIRATORY_TRACT | 11 refills | Status: DC

## 2022-01-17 NOTE — Progress Notes (Signed)
Subjective:  Patient ID: Natalie Valencia, female    DOB: 1956/09/03  Age: 66 y.o. MRN: [id number]  CC: Medical Management of Chronic Issues   HPI Natalie Valencia presents for  follow-up on  thyroid. The patient has a history of hypothyroidism for many years. It has been stable recently. Pt. denies any change in  voice, loss of hair, heat or cold intolerance. Energy level has been adequate to good. Patient reports chronic constipation but has no diarrhea. No myxedema. Medication is as noted below. Verified that pt is taking it daily on an empty stomach. Well tolerated.   Patient in for follow-up of elevated cholesterol. Doing well without complaints on current medication. Denies side effects of statin including myalgia and arthralgia and nausea. Also in today for liver function testing. Currently no chest pain, shortness of breath or other cardiovascular related symptoms noted.  Getting IV i Restasis for osteo next month.  Continues to take Evista as well.  She is due for a bone density test.     01/17/2022    8:14 AM 01/17/2022    8:05 AM 07/20/2021    1:50 PM  Depression screen PHQ 2/9  Decreased Interest 0 0 0  Down, Depressed, Hopeless 0 0 0  PHQ - 2 Score 0 0 0  Altered sleeping 1    Tired, decreased energy 1    Change in appetite 0    Feeling bad or failure about yourself  0    Trouble concentrating 0    Moving slowly or fidgety/restless 0    Suicidal thoughts 0    PHQ-9 Score 2    Difficult doing work/chores Somewhat difficult      History Natalie Valencia has a past medical history of Allergy, Asthma, Back pain, GERD (gastroesophageal reflux disease), Hyperlipidemia, Hypertension, Hypothyroidism, Osteopenia, and Thyroid disease.   She has a past surgical history that includes Abdominal hysterectomy (2001); Nasal sinus surgery; Colonoscopy; Upper gastrointestinal endoscopy; Dilation and curettage of uterus; and Kyphoplasty (N/A, 03/22/2021).   Her family history includes Alcoholism in  her maternal grandfather; Cancer in her mother; Diabetes in her father; Stroke in her paternal grandmother.She reports that she has never smoked. She has never used smokeless tobacco. She reports that she does not drink alcohol and does not use drugs.    ROS Review of Systems  Constitutional: Negative.   HENT: Negative.    Eyes:  Negative for visual disturbance.  Respiratory:  Negative for shortness of breath.   Cardiovascular:  Negative for chest pain.  Gastrointestinal:  Negative for abdominal pain.  Musculoskeletal:  Negative for arthralgias.   Objective:  BP 132/69   Pulse 74   Temp (!) 97.2 F (36.2 C)   Ht 5\' 7"  (1.702 m)   Wt 161 lb 6.4 oz (73.2 kg)   SpO2 96%   BMI 25.28 kg/m   BP Readings from Last 3 Encounters:  01/17/22 132/69  04/26/21 (!) 148/83  03/22/21 139/71    Wt Readings from Last 3 Encounters:  01/17/22 161 lb 6.4 oz (73.2 kg)  07/20/21 165 lb 9.6 oz (75.1 kg)  03/22/21 160 lb (72.6 kg)     Physical Exam Constitutional:      General: She is not in acute distress.    Appearance: She is well-developed.  HENT:     Head: Normocephalic and atraumatic.  Eyes:     Conjunctiva/sclera: Conjunctivae normal.     Pupils: Pupils are equal, round, and reactive to light.  Neck:  Thyroid: No thyromegaly.  Cardiovascular:     Rate and Rhythm: Normal rate and regular rhythm.     Heart sounds: Normal heart sounds. No murmur heard. Pulmonary:     Effort: Pulmonary effort is normal. No respiratory distress.     Breath sounds: Normal breath sounds. No wheezing or rales.  Abdominal:     General: Bowel sounds are normal. There is no distension.     Palpations: Abdomen is soft.     Tenderness: There is no abdominal tenderness.  Musculoskeletal:        General: Normal range of motion.     Cervical back: Normal range of motion and neck supple.     Comments: The right foot has hematomas about the first through third MTP areas.  There is moderate edema of the  dorsal f forefoot and midfoot.  There is tenderness in the dorsal midfoot as well.  Lymphadenopathy:     Cervical: No cervical adenopathy.  Skin:    General: Skin is warm and dry.  Neurological:     Mental Status: She is alert and oriented to person, place, and time.  Psychiatric:        Behavior: Behavior normal.        Thought Content: Thought content normal.        Judgment: Judgment normal.      Assessment & Plan:   Natalie Valencia was seen today for medical management of chronic issues.  Diagnoses and all orders for this visit:  Essential hypertension, benign -     CBC with Differential/Platelet -     CMP14+EGFR  Acquired hypothyroidism -     TSH + free T4 -     levothyroxine (SYNTHROID) 50 MCG tablet; Take 1 tablet (50 mcg total) by mouth daily.  Hyperlipidemia with target LDL less than 100 -     Lipid panel  Gastritis and gastroduodenitis  Age-related osteoporosis without current pathological fracture -     raloxifene (EVISTA) 60 MG tablet; Take 1 tablet (60 mg total) by mouth daily. For bone health -     DG Bone Density; Future  Right foot sprain, initial encounter -     DG Foot Complete Right; Future  Other orders -     rosuvastatin (CRESTOR) 5 MG tablet; Take 1 tablet (5 mg total) by mouth daily. For cholesterol -     fexofenadine-pseudoephedrine (ALLEGRA-D 24) 180-240 MG 24 hr tablet; Take 1 tablet by mouth every evening. For allergy and congestion -     fluticasone furoate-vilanterol (BREO ELLIPTA) 200-25 MCG/ACT AEPB; Inhale 1 puff into the lungs daily.       I have discontinued Natalie Valencia's budesonide-formoterol. I have also changed her levothyroxine. Additionally, I am having her start on fexofenadine-pseudoephedrine and fluticasone furoate-vilanterol. Lastly, I am having her maintain her AMBULATORY NON FORMULARY MEDICATION, albuterol, guaiFENesin, benzonatate, oxymetazoline, pseudoephedrine-acetaminophen, acetaminophen, vitamin B-12, calcium-vitamin D,  docusate sodium, ibuprofen, pantoprazole, raloxifene, and rosuvastatin. We will continue to administer zoledronic acid.  Allergies as of 01/17/2022   No Known Allergies      Medication List        Accurate as of January 17, 2022  9:11 AM. If you have any questions, ask your nurse or doctor.          STOP taking these medications    budesonide-formoterol 160-4.5 MCG/ACT inhaler Commonly known as: SYMBICORT Stopped by: Mechele Claude, MD       TAKE these medications    acetaminophen 500 MG tablet Commonly known  as: TYLENOL Take 1,000 mg by mouth every 8 (eight) hours as needed for moderate pain.   albuterol 108 (90 Base) MCG/ACT inhaler Commonly known as: VENTOLIN HFA Inhale 2 puffs into the lungs every 4 (four) hours as needed for wheezing or shortness of breath.   AMBULATORY NON FORMULARY MEDICATION Allergy Shots Take 1 injection every 2 weeks   benzonatate 100 MG capsule Commonly known as: TESSALON Take 100 mg by mouth 3 (three) times daily as needed for cough.   calcium-vitamin D 500-200 MG-UNIT tablet Commonly known as: OSCAL WITH D Take 2 tablets by mouth.   docusate sodium 100 MG capsule Commonly known as: COLACE Take 100 mg by mouth as needed for mild constipation.   fexofenadine-pseudoephedrine 180-240 MG 24 hr tablet Commonly known as: ALLEGRA-D 24 Take 1 tablet by mouth every evening. For allergy and congestion Started by: Mechele Claude, MD   fluticasone furoate-vilanterol 200-25 MCG/ACT Aepb Commonly known as: Breo Ellipta Inhale 1 puff into the lungs daily. Started by: Mechele Claude, MD   guaiFENesin 600 MG 12 hr tablet Commonly known as: MUCINEX Take 600 mg by mouth 2 (two) times daily as needed for cough or to loosen phlegm.   ibuprofen 100 MG tablet Commonly known as: ADVIL Take 200 mg by mouth as needed for fever.   levothyroxine 50 MCG tablet Commonly known as: SYNTHROID Take 1 tablet (50 mcg total) by mouth daily. What changed:  when to take this   oxymetazoline 0.05 % nasal spray Commonly known as: AFRIN Place 1 spray into both nostrils 2 (two) times daily as needed for congestion.   pantoprazole 40 MG tablet Commonly known as: PROTONIX Take 1 tablet by mouth once daily   pseudoephedrine-acetaminophen 30-500 MG Tabs tablet Commonly known as: TYLENOL SINUS Take 1 tablet by mouth every 4 (four) hours as needed (congestion).   raloxifene 60 MG tablet Commonly known as: Evista Take 1 tablet (60 mg total) by mouth daily. For bone health   rosuvastatin 5 MG tablet Commonly known as: Crestor Take 1 tablet (5 mg total) by mouth daily. For cholesterol   vitamin B-12 500 MCG tablet Commonly known as: CYANOCOBALAMIN Take 1,000 mcg by mouth daily.         Follow-up: No follow-ups on file.  Mechele Claude, M.D.

## 2022-01-18 ENCOUNTER — Encounter: Payer: Self-pay | Admitting: Family Medicine

## 2022-01-18 LAB — CBC WITH DIFFERENTIAL/PLATELET
Basophils Absolute: 0.1 10*3/uL (ref 0.0–0.2)
Basos: 2 %
EOS (ABSOLUTE): 1.3 10*3/uL — ABNORMAL HIGH (ref 0.0–0.4)
Eos: 20 %
Hematocrit: 42.3 % (ref 34.0–46.6)
Hemoglobin: 14 g/dL (ref 11.1–15.9)
Immature Grans (Abs): 0 10*3/uL (ref 0.0–0.1)
Immature Granulocytes: 0 %
Lymphocytes Absolute: 1.7 10*3/uL (ref 0.7–3.1)
Lymphs: 27 %
MCH: 29.9 pg (ref 26.6–33.0)
MCHC: 33.1 g/dL (ref 31.5–35.7)
MCV: 90 fL (ref 79–97)
Monocytes Absolute: 0.6 10*3/uL (ref 0.1–0.9)
Monocytes: 9 %
Neutrophils Absolute: 2.8 10*3/uL (ref 1.4–7.0)
Neutrophils: 42 %
Platelets: 335 10*3/uL (ref 150–450)
RBC: 4.68 x10E6/uL (ref 3.77–5.28)
RDW: 12.7 % (ref 11.7–15.4)
WBC: 6.5 10*3/uL (ref 3.4–10.8)

## 2022-01-18 LAB — CMP14+EGFR
ALT: 11 IU/L (ref 0–32)
AST: 17 IU/L (ref 0–40)
Albumin/Globulin Ratio: 1.9 (ref 1.2–2.2)
Albumin: 4.5 g/dL (ref 3.8–4.8)
Alkaline Phosphatase: 83 IU/L (ref 44–121)
BUN/Creatinine Ratio: 15 (ref 12–28)
BUN: 13 mg/dL (ref 8–27)
Bilirubin Total: 0.3 mg/dL (ref 0.0–1.2)
CO2: 24 mmol/L (ref 20–29)
Calcium: 9.8 mg/dL (ref 8.7–10.3)
Chloride: 104 mmol/L (ref 96–106)
Creatinine, Ser: 0.87 mg/dL (ref 0.57–1.00)
Globulin, Total: 2.4 g/dL (ref 1.5–4.5)
Glucose: 100 mg/dL — ABNORMAL HIGH (ref 70–99)
Potassium: 4.7 mmol/L (ref 3.5–5.2)
Sodium: 141 mmol/L (ref 134–144)
Total Protein: 6.9 g/dL (ref 6.0–8.5)
eGFR: 73 mL/min/{1.73_m2} (ref >59)

## 2022-01-18 LAB — LIPID PANEL
Chol/HDL Ratio: 2 ratio (ref 0.0–4.4)
Cholesterol, Total: 182 mg/dL (ref 100–199)
HDL: 91 mg/dL (ref >39)
LDL Chol Calc (NIH): 72 mg/dL (ref 0–99)
Triglycerides: 112 mg/dL (ref 0–149)
VLDL Cholesterol Cal: 19 mg/dL (ref 5–40)

## 2022-01-18 LAB — TSH+FREE T4
Free T4: 1.51 ng/dL (ref 0.82–1.77)
TSH: 2.96 u[IU]/mL (ref 0.450–4.500)

## 2022-01-18 NOTE — Progress Notes (Signed)
Hello Natalie Valencia,  Your lab result is normal and/or stable.Some minor variations that are not significant are commonly marked abnormal, but do not represent any medical problem for you.  Best regards, Toshiro Hanken, M.D.

## 2022-01-23 ENCOUNTER — Ambulatory Visit (INDEPENDENT_AMBULATORY_CARE_PROVIDER_SITE_OTHER): Payer: Medicare Other

## 2022-01-23 DIAGNOSIS — M810 Age-related osteoporosis without current pathological fracture: Secondary | ICD-10-CM

## 2022-01-25 DIAGNOSIS — M810 Age-related osteoporosis without current pathological fracture: Secondary | ICD-10-CM | POA: Diagnosis not present

## 2022-01-30 ENCOUNTER — Other Ambulatory Visit: Payer: Self-pay | Admitting: Family Medicine

## 2022-01-30 DIAGNOSIS — Z1231 Encounter for screening mammogram for malignant neoplasm of breast: Secondary | ICD-10-CM

## 2022-01-30 NOTE — Progress Notes (Signed)
DEXA shows osteoporosis. Continue raloxifene ?Thanks, ?WS ?

## 2022-04-10 ENCOUNTER — Telehealth: Payer: Self-pay | Admitting: Family Medicine

## 2022-04-10 ENCOUNTER — Other Ambulatory Visit: Payer: Self-pay | Admitting: Family Medicine

## 2022-04-10 DIAGNOSIS — M810 Age-related osteoporosis without current pathological fracture: Secondary | ICD-10-CM

## 2022-04-10 MED ORDER — ZOLEDRONIC ACID 5 MG/100ML IV SOLN: 5.0000 mg | Freq: Once | INTRAVENOUS | Status: DC

## 2022-04-10 NOTE — Telephone Encounter (Signed)
I sent in an order for Reclast. Heck with infusion center to see if it was sent in the right format.

## 2022-04-11 NOTE — Telephone Encounter (Signed)
CALLED PATIENT TO ASK IF SHE IS GOING TO [place] OR [place]  NO ANSWER, LEFT MESSAGE TO RETURN CALL

## 2022-04-12 DIAGNOSIS — H5213 Myopia, bilateral: Secondary | ICD-10-CM | POA: Diagnosis not present

## 2022-04-12 NOTE — Telephone Encounter (Signed)
Pt is going to [facility]

## 2022-04-12 NOTE — Telephone Encounter (Signed)
Called patient, no answer, left message to return call 

## 2022-04-16 NOTE — Telephone Encounter (Signed)
CALLED PATIENT TO DISCUSS, NO ANSWER, LEFT MESSAGE TO RETURN CALL

## 2022-04-17 NOTE — Telephone Encounter (Signed)
Patient states she is going to [place] and she needs an order for Reclast sent to [facility].

## 2022-04-18 ENCOUNTER — Other Ambulatory Visit: Payer: Self-pay | Admitting: *Deleted

## 2022-04-18 NOTE — Telephone Encounter (Signed)
Message left at infusion center requesting Reclast order form to be faxed to office.

## 2022-04-26 ENCOUNTER — Encounter (HOSPITAL_COMMUNITY): Payer: BC Managed Care – PPO

## 2022-04-27 NOTE — Telephone Encounter (Signed)
Left message to return call.  Referral was placed for IR in March by [facility].

## 2022-04-27 NOTE — Telephone Encounter (Signed)
Pt returned missed call. Requested for nurse to call her back about this.

## 2022-05-03 ENCOUNTER — Other Ambulatory Visit: Payer: Self-pay | Admitting: *Deleted

## 2022-05-03 NOTE — Telephone Encounter (Signed)
Have called infusion center again and left message requesting referral form be faxed to [place].

## 2022-05-08 ENCOUNTER — Telehealth: Payer: Self-pay | Admitting: Family Medicine

## 2022-05-08 ENCOUNTER — Other Ambulatory Visit: Payer: Self-pay

## 2022-05-08 NOTE — Telephone Encounter (Signed)
Patient is calling back with a fax number for [place] to send prescription. She said the phone number that we have is the correct number.   Fax number given [phone]

## 2022-05-08 NOTE — Telephone Encounter (Signed)
Pt is aware that we have made multiple attempts to get her scheduled. Haroldine Laws has left 2 messages. I called today and there was no answer and was sent to vmail.  Pt states that she received a letter and called them. Pt will call back with number to see if it is different from the one we have been calling.  We have [phone]

## 2022-05-15 ENCOUNTER — Telehealth: Payer: Self-pay | Admitting: Family Medicine

## 2022-05-20 ENCOUNTER — Other Ambulatory Visit: Payer: Self-pay | Admitting: Family Medicine

## 2022-05-20 DIAGNOSIS — M810 Age-related osteoporosis without current pathological fracture: Secondary | ICD-10-CM

## 2022-05-20 MED ORDER — ZOLEDRONIC ACID 5 MG/100ML IV SOLN
5.0000 mg | Freq: Once | INTRAVENOUS | Status: AC
  Administered 2024-06-05: 5 mg via INTRAVENOUS

## 2022-05-20 NOTE — Telephone Encounter (Signed)
I ordered the medicine. Now she just needs arrangements made with  the infusion center. Cathy or Courney can you help with this?

## 2022-05-22 ENCOUNTER — Other Ambulatory Visit: Payer: Self-pay

## 2022-05-22 ENCOUNTER — Telehealth: Payer: Self-pay | Admitting: Pharmacy Technician

## 2022-05-22 ENCOUNTER — Telehealth: Payer: Self-pay

## 2022-05-22 NOTE — Telephone Encounter (Signed)
Called and spoke with Asher Muir at Dr. Darlyn Read office. They have placed an order for reclast but are unable to put it in as a therapy plan which is the context we need. I have messaged our manager Bayard Hugger to assist. Thanks!

## 2022-05-22 NOTE — Telephone Encounter (Signed)
Pt called stating that she called the Infusion Center to schedule her appt and was told that they dont have any of her info. Spoke with a lady named Vernona Rieger. Pt unable to schedule an appt.   Please advise.

## 2022-05-22 NOTE — Telephone Encounter (Signed)
Dr. Darlyn Read, [place] Childes note:   Auth Submission: no auth needed Payer: [employer] Medication & CPT/J Code(s) submitted: Reclast (Zolendronic acid) J3489 Route of submission (phone, fax, portal): PORTAL Auth type: Buy/Bill Units/visits requested: X1 DOSE Reference number:  Approval from: 05/22/22 to 10/28/22   Patient will be scheduled as soon as possible

## 2022-05-24 ENCOUNTER — Encounter: Payer: Self-pay | Admitting: Pulmonary Disease

## 2022-05-24 ENCOUNTER — Ambulatory Visit (INDEPENDENT_AMBULATORY_CARE_PROVIDER_SITE_OTHER): Payer: Medicare Other | Admitting: *Deleted

## 2022-05-24 VITALS — BP 122/74 | HR 66 | Temp 97.9°F | Resp 16 | Ht 68.0 in | Wt 158.0 lb

## 2022-05-24 DIAGNOSIS — M810 Age-related osteoporosis without current pathological fracture: Secondary | ICD-10-CM

## 2022-05-24 MED ORDER — DIPHENHYDRAMINE HCL 25 MG PO CAPS
25.0000 mg | ORAL_CAPSULE | Freq: Once | ORAL | Status: AC
  Administered 2022-05-24: 25 mg via ORAL
  Filled 2022-05-24: qty 1

## 2022-05-24 MED ORDER — ZOLEDRONIC ACID 5 MG/100ML IV SOLN
5.0000 mg | Freq: Once | INTRAVENOUS | Status: AC
  Administered 2022-05-24: 5 mg via INTRAVENOUS
  Filled 2022-05-24: qty 100

## 2022-05-24 MED ORDER — SODIUM CHLORIDE 0.9 % IV SOLN: INTRAVENOUS | Status: DC

## 2022-05-24 MED ORDER — ACETAMINOPHEN 325 MG PO TABS
650.0000 mg | ORAL_TABLET | Freq: Once | ORAL | Status: AC
  Administered 2022-05-24: 650 mg via ORAL
  Filled 2022-05-24: qty 2

## 2022-05-24 NOTE — Progress Notes (Signed)
Diagnosis: Osteoporosis  Provider:  Chilton Greathouse, MD  Procedure: Infusion  IV Type: Peripheral, IV Location: R Antecubital  Reclast (Zolendronic Acid), Dose: 5 mg  Infusion Start Time: 0958 am  Infusion Stop Time: 1030 am  Post Infusion IV Care: Observation period completed and Peripheral IV Discontinued  Discharge: Condition: Good, Destination: Home . AVS provided to patient.   Performed by:  Forrest Moron, RN

## 2022-05-24 NOTE — Telephone Encounter (Signed)
Looks like orders were corrected on 05/22/22 by another department & pt was seen today 05/24/22

## 2022-06-05 DIAGNOSIS — H2513 Age-related nuclear cataract, bilateral: Secondary | ICD-10-CM | POA: Diagnosis not present

## 2022-06-05 DIAGNOSIS — H43812 Vitreous degeneration, left eye: Secondary | ICD-10-CM | POA: Diagnosis not present

## 2022-06-05 DIAGNOSIS — D3131 Benign neoplasm of right choroid: Secondary | ICD-10-CM | POA: Diagnosis not present

## 2022-06-05 DIAGNOSIS — H43821 Vitreomacular adhesion, right eye: Secondary | ICD-10-CM | POA: Diagnosis not present

## 2022-06-25 ENCOUNTER — Telehealth: Payer: Self-pay

## 2022-06-25 NOTE — Chronic Care Management (AMB) (Signed)
  Care Management   Outreach Note  06/25/2022 Name: Bessy Reaney MRN: [id number] DOB: Jan 27, 1956  An unsuccessful telephone outreach was attempted today. The patient was referred to the case management team for assistance with care management and care coordination.   Follow Up Plan:  A HIPAA compliant phone message was left for the patient providing contact information and requesting a return call.  The care management team will reach out to the patient again over the next 7 days.  If patient returns call to provider office, please advise to call  Care Management Care Guide Penne Lash * at [phone]*  Penne Lash, RMA Care Guide [facility]  [place], [place] [address] Direct Dial: [phone] [email or web address]

## 2022-07-03 NOTE — Chronic Care Management (AMB) (Signed)
  Care Coordination  Outreach Note  07/03/2022 Name: Natalie Valencia MRN: [id number] DOB: [birth year]   Care Coordination Outreach Attempts  A second unsuccessful outreach was attempted today to offer the patient with information about available care coordination services as a benefit of their health plan.     Follow Up Plan:  Additional outreach attempts will be made to offer the patient care coordination information and services.   Encounter Outcome:  No Answer  Sig Penne Lash, RMA Care Guide [facility]  [place], [place] [address] Direct Dial: [phone] [email or web address]

## 2022-07-04 IMAGING — DX DG LUMBAR SPINE 2-3V
2 series · 2 of 2 positions shown · non-contrast
Comparison: None.

CLINICAL DATA: Pain after coughing spasm, initial encounter.

EXAM:
LUMBAR SPINE - 2-3 VIEW

[l-spine ap]
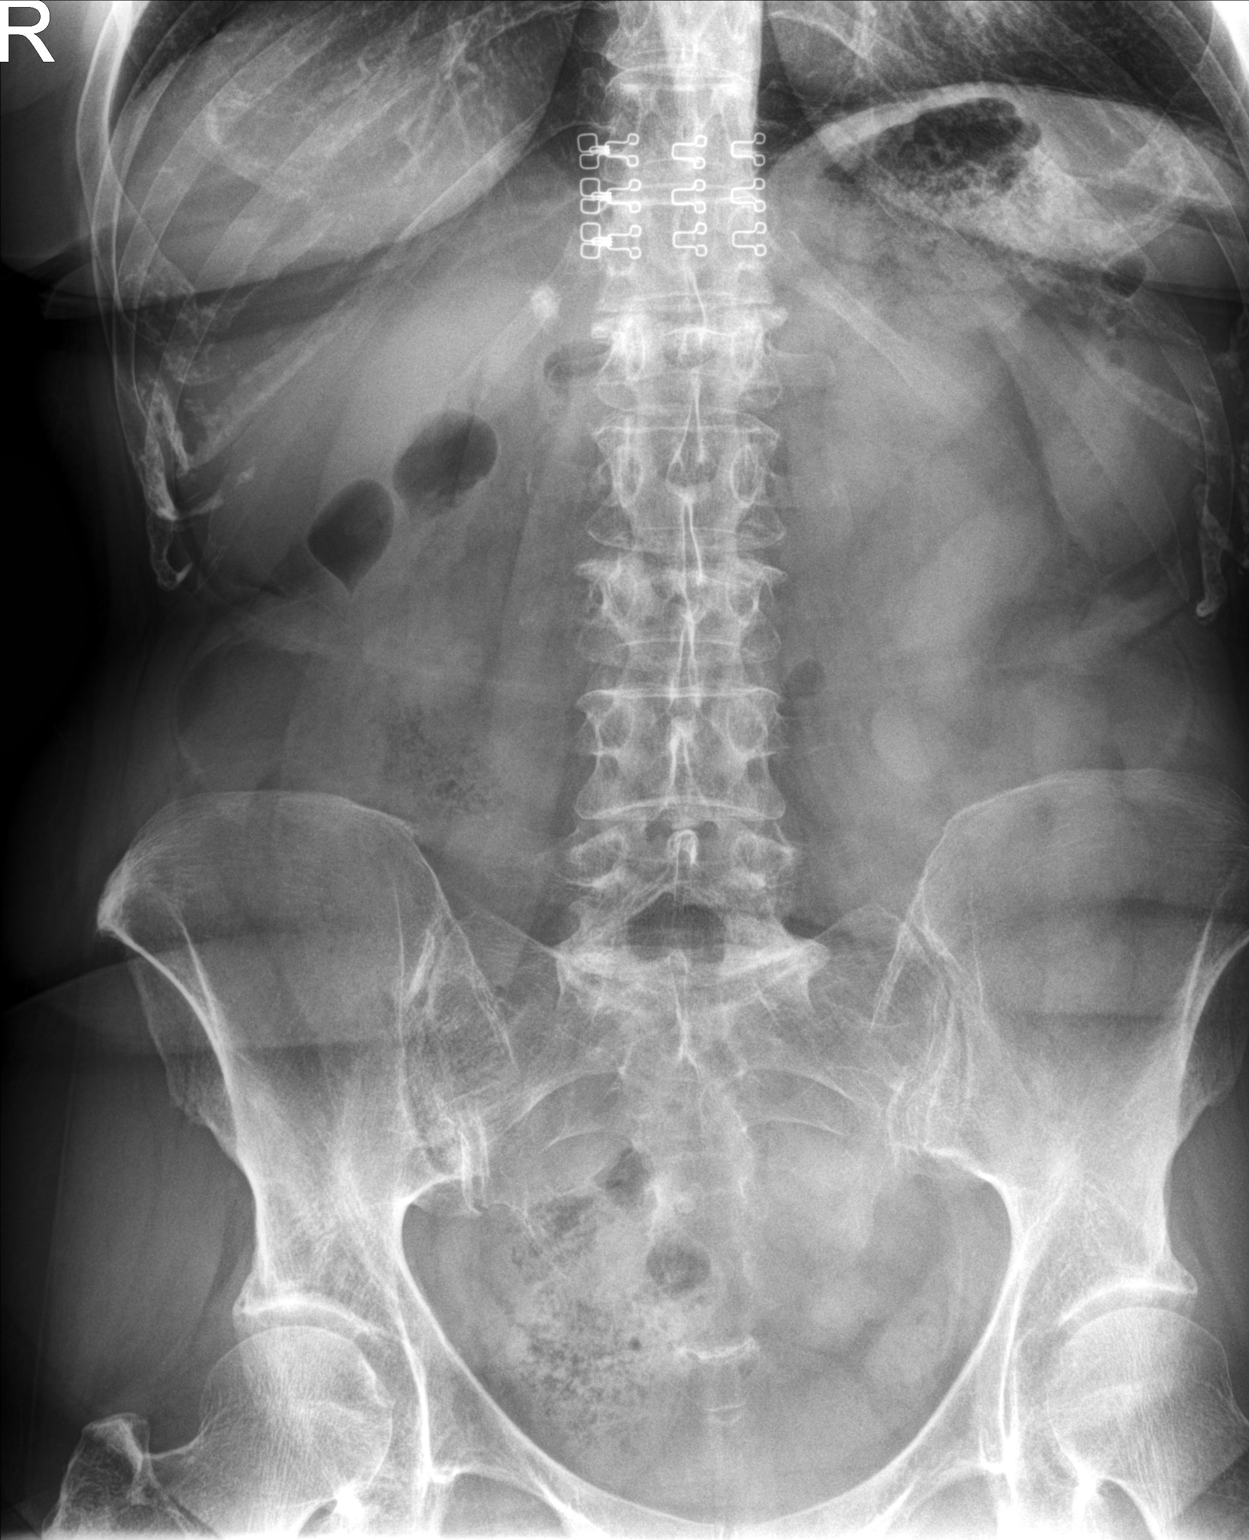

[l-spine lat]
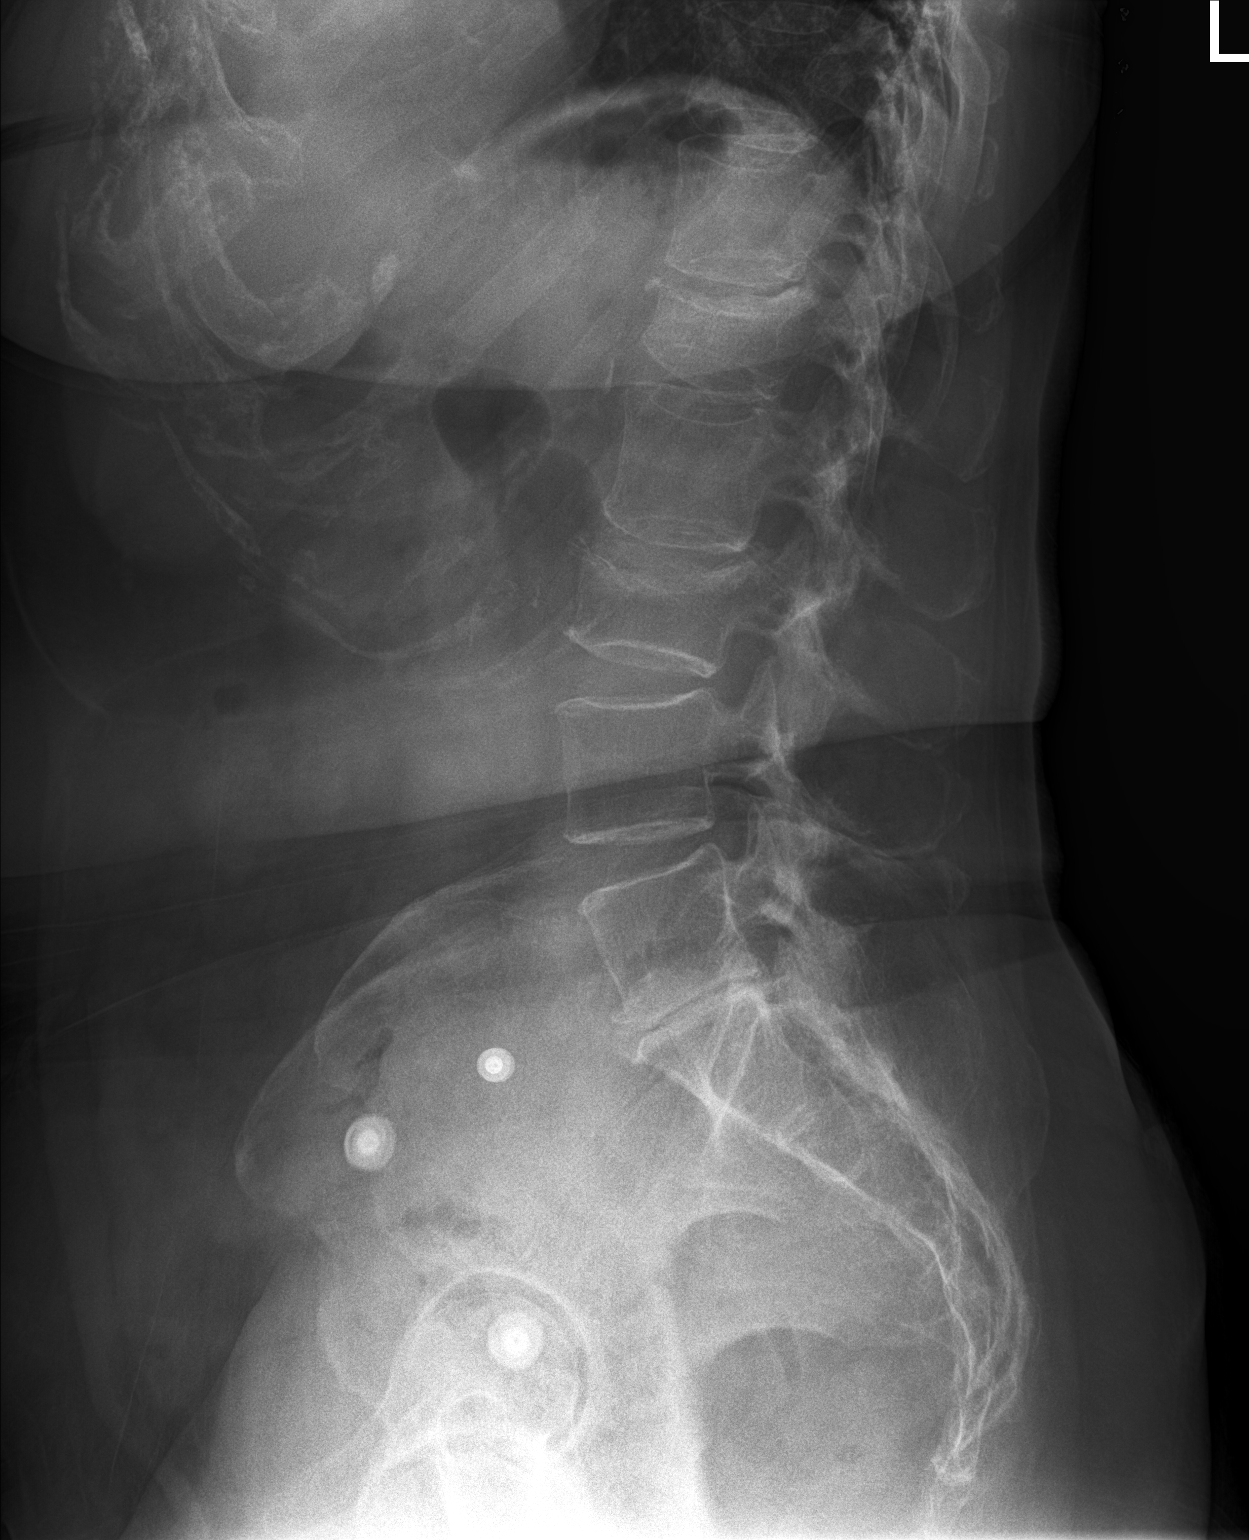

[2 of 2 positions shown; findings below may reference images not displayed]

FINDINGS: L1 and L3 compression deformities are age indeterminate. Vertebral
body height is otherwise maintained. Alignment is anatomic.
Osteopenia. Endplate degenerative changes and loss of disc space
height at L5-S1, with facet hypertrophy.
IMPRESSION: 1. L1 and L3 compression deformities are age indeterminate.
2. Advanced degenerative disc disease at L5-S1 with facet
hypertrophy.
3. Osteopenia.

## 2022-07-17 NOTE — Chronic Care Management (AMB) (Signed)
  Care Coordination  Outreach Note  07/17/2022 Name: Natalie Valencia MRN: [id number] DOB: 06-26-1956   Care Coordination Outreach Attempts: A third unsuccessful outreach was attempted today to offer the patient with information about available care coordination services as a benefit of their health plan.   Follow Up Plan:  No further outreach attempts will be made at this time. We have been unable to contact the patient to offer or enroll patient in care coordination services  Encounter Outcome:  No Answer  [place], [place], [place] [address] Direct Dial: [phone] [email or web address]

## 2022-07-19 ENCOUNTER — Ambulatory Visit (INDEPENDENT_AMBULATORY_CARE_PROVIDER_SITE_OTHER): Payer: Medicare Other | Admitting: Family Medicine

## 2022-07-19 ENCOUNTER — Encounter: Payer: Self-pay | Admitting: Family Medicine

## 2022-07-19 VITALS — BP 128/71 | HR 81 | Temp 97.5°F | Ht 68.0 in | Wt 157.2 lb

## 2022-07-19 DIAGNOSIS — K2970 Gastritis, unspecified, without bleeding: Secondary | ICD-10-CM | POA: Diagnosis not present

## 2022-07-19 DIAGNOSIS — I10 Essential (primary) hypertension: Secondary | ICD-10-CM | POA: Diagnosis not present

## 2022-07-19 DIAGNOSIS — E785 Hyperlipidemia, unspecified: Secondary | ICD-10-CM | POA: Diagnosis not present

## 2022-07-19 DIAGNOSIS — E039 Hypothyroidism, unspecified: Secondary | ICD-10-CM

## 2022-07-19 DIAGNOSIS — K2990 Gastroduodenitis, unspecified, without bleeding: Secondary | ICD-10-CM

## 2022-07-19 MED ORDER — FLUTICASONE-SALMETEROL 500-50 MCG/ACT IN AEPB
1.0000 | INHALATION_SPRAY | Freq: Two times a day (BID) | RESPIRATORY_TRACT | 11 refills | Status: DC
Start: 2022-07-19 — End: 2023-01-23

## 2022-07-19 MED ORDER — PANTOPRAZOLE SODIUM 40 MG PO TBEC: 40.0000 mg | DELAYED_RELEASE_TABLET | Freq: Every day | ORAL | 3 refills | Status: DC

## 2022-07-19 NOTE — Progress Notes (Signed)
Subjective:  Patient ID: Natalie Valencia, female    DOB: [birth year]  Age: 66 y.o. MRN: [id number]  CC: Medical Management of Chronic Issues   HPI [name] presents for  follow-up of hypertension. Patient has no history of headache chest pain or shortness of breath or recent cough. Patient also denies symptoms of TIA such as focal numbness or weakness. Patient denies side effects from medication. States taking it regularly.   in for follow-up of elevated cholesterol. Doing well without complaints on current medication. Denies side effects of statin including myalgia and arthralgia and nausea. Currently no chest pain, shortness of breath or other cardiovascular related symptoms noted.   follow-up on  thyroid. The patient has a history of hypothyroidism for many years. It has been stable recently. Pt. denies any change in  voice, loss of hair, heat or cold intolerance. Energy level has been adequate to good. Patient denies constipation and diarrhea. No myxedema. Medication is as noted below. Verified that pt is taking it daily on an empty stomach. Well tolerated.       History [name] has a past medical history of Allergy, Asthma, Back pain, GERD (gastroesophageal reflux disease), Hyperlipidemia, Hypertension, Hypothyroidism, Osteopenia, and Thyroid disease.   She has a past surgical history that includes Abdominal hysterectomy (2001); Nasal sinus surgery; Colonoscopy; Upper gastrointestinal endoscopy; Dilation and curettage of uterus; and Kyphoplasty (N/A, 03/22/2021).   Her family history includes Alcoholism in her maternal grandfather; Cancer in her mother; Diabetes in her father; Stroke in her paternal grandmother.She reports that she has never smoked. She has never used smokeless tobacco. She reports that she does not drink alcohol and does not use drugs.  Current Outpatient Medications on File Prior to Visit  Medication Sig Dispense Refill   acetaminophen (TYLENOL) 500 MG tablet  Take 1,000 mg by mouth every 8 (eight) hours as needed for moderate pain.     albuterol (VENTOLIN HFA) 108 (90 Base) MCG/ACT inhaler Inhale 2 puffs into the lungs every 4 (four) hours as needed for wheezing or shortness of breath.     AMBULATORY NON FORMULARY MEDICATION Allergy Shots Take 1 injection every 2 weeks     benzonatate (TESSALON) 100 MG capsule Take 100 mg by mouth 3 (three) times daily as needed for cough.     calcium-vitamin D (OSCAL WITH D) 500-200 MG-UNIT tablet Take 2 tablets by mouth.     docusate sodium (COLACE) 100 MG capsule Take 100 mg by mouth as needed for mild constipation.     ibuprofen (ADVIL) 100 MG tablet Take 200 mg by mouth as needed for fever.     levothyroxine (SYNTHROID) 50 MCG tablet Take 1 tablet (50 mcg total) by mouth daily. 90 tablet 3   oxymetazoline (AFRIN) 0.05 % nasal spray Place 1 spray into both nostrils 2 (two) times daily as needed for congestion.     pseudoephedrine-acetaminophen (TYLENOL SINUS) 30-500 MG TABS tablet Take 1 tablet by mouth every 4 (four) hours as needed (congestion).     raloxifene (EVISTA) 60 MG tablet Take 1 tablet (60 mg total) by mouth daily. For bone health 90 tablet 3   rosuvastatin (CRESTOR) 5 MG tablet Take 1 tablet (5 mg total) by mouth daily. For cholesterol 90 tablet 3   vitamin B-12 (CYANOCOBALAMIN) 500 MCG tablet Take 1,000 mcg by mouth daily.     Current Facility-Administered Medications on File Prior to Visit  Medication Dose Route Frequency Provider Last Rate Last Admin   zoledronic acid (RECLAST) injection 5  mg  5 mg Intravenous Once Claretta Fraise, MD        ROS Review of Systems  Constitutional: Negative.   HENT: Negative.    Eyes:  Negative for visual disturbance.  Respiratory:  Positive for cough (Constant cough having drainage. Allergist said it wasn't allergy. Onset of sx was 2 years. Constant drainage. Thick in the morning.), shortness of breath and wheezing.   Cardiovascular:  Negative for chest pain.   Gastrointestinal:  Negative for abdominal pain.  Musculoskeletal:  Negative for arthralgias.    Objective:  BP 128/71   Pulse 81   Temp (!) 97.5 F (36.4 C)   Ht 5' 8"  (1.727 m)   Wt 157 lb 3.2 oz (71.3 kg)   SpO2 95%   BMI 23.90 kg/m   BP Readings from Last 3 Encounters:  07/19/22 128/71  05/24/22 122/74  01/17/22 132/69    Wt Readings from Last 3 Encounters:  07/19/22 157 lb 3.2 oz (71.3 kg)  05/24/22 158 lb (71.7 kg)  01/17/22 161 lb 6.4 oz (73.2 kg)     Physical Exam Constitutional:      General: She is not in acute distress.    Appearance: She is well-developed.  Cardiovascular:     Rate and Rhythm: Normal rate and regular rhythm.  Pulmonary:     Effort: Pulmonary effort is normal.     Breath sounds: Wheezing (all fields) present.  Abdominal:     Palpations: Abdomen is soft.     Tenderness: There is no abdominal tenderness.  Musculoskeletal:        General: Normal range of motion.  Skin:    General: Skin is warm and dry.  Neurological:     Mental Status: She is alert and oriented to person, place, and time.       Assessment & Plan:   Raevyn was seen today for medical management of chronic issues.  Diagnoses and all orders for this visit:  Acquired hypothyroidism -     TSH + free T4  Essential hypertension, benign -     CBC with Differential/Platelet -     CMP14+EGFR  Hyperlipidemia with target LDL less than 100 -     Lipid panel  Gastritis and gastroduodenitis -     pantoprazole (PROTONIX) 40 MG tablet; Take 1 tablet (40 mg total) by mouth daily.  Other orders -     fluticasone-salmeterol (ADVAIR DISKUS) 500-50 MCG/ACT AEPB; Inhale 1 puff into the lungs in the morning and at bedtime.   Allergies as of 07/19/2022   No Known Allergies      Medication List        Accurate as of July 19, 2022  9:33 AM. If you have any questions, ask your nurse or doctor.          STOP taking these medications     fexofenadine-pseudoephedrine 180-240 MG 24 hr tablet Commonly known as: ALLEGRA-D 24 Stopped by: Claretta Fraise, MD   fluticasone furoate-vilanterol 200-25 MCG/ACT Aepb Commonly known as: Breo Ellipta Stopped by: Claretta Fraise, MD   guaiFENesin 600 MG 12 hr tablet Commonly known as: MUCINEX Stopped by: Claretta Fraise, MD       TAKE these medications    acetaminophen 500 MG tablet Commonly known as: TYLENOL Take 1,000 mg by mouth every 8 (eight) hours as needed for moderate pain.   albuterol 108 (90 Base) MCG/ACT inhaler Commonly known as: VENTOLIN HFA Inhale 2 puffs into the lungs every 4 (four) hours as needed for  wheezing or shortness of breath.   AMBULATORY NON FORMULARY MEDICATION Allergy Shots Take 1 injection every 2 weeks   benzonatate 100 MG capsule Commonly known as: TESSALON Take 100 mg by mouth 3 (three) times daily as needed for cough.   calcium-vitamin D 500-200 MG-UNIT tablet Commonly known as: OSCAL WITH D Take 2 tablets by mouth.   cyanocobalamin 500 MCG tablet Commonly known as: VITAMIN B12 Take 1,000 mcg by mouth daily.   docusate sodium 100 MG capsule Commonly known as: COLACE Take 100 mg by mouth as needed for mild constipation.   fluticasone-salmeterol 500-50 MCG/ACT Aepb Commonly known as: Advair Diskus Inhale 1 puff into the lungs in the morning and at bedtime. Started by: Claretta Fraise, MD   ibuprofen 100 MG tablet Commonly known as: ADVIL Take 200 mg by mouth as needed for fever.   levothyroxine 50 MCG tablet Commonly known as: SYNTHROID Take 1 tablet (50 mcg total) by mouth daily.   oxymetazoline 0.05 % nasal spray Commonly known as: AFRIN Place 1 spray into both nostrils 2 (two) times daily as needed for congestion.   pantoprazole 40 MG tablet Commonly known as: PROTONIX Take 1 tablet (40 mg total) by mouth daily.   pseudoephedrine-acetaminophen 30-500 MG Tabs tablet Commonly known as: TYLENOL SINUS Take 1 tablet by  mouth every 4 (four) hours as needed (congestion).   raloxifene 60 MG tablet Commonly known as: Evista Take 1 tablet (60 mg total) by mouth daily. For bone health   rosuvastatin 5 MG tablet Commonly known as: Crestor Take 1 tablet (5 mg total) by mouth daily. For cholesterol        Meds ordered this encounter  Medications   pantoprazole (PROTONIX) 40 MG tablet    Sig: Take 1 tablet (40 mg total) by mouth daily.    Dispense:  90 tablet    Refill:  3   fluticasone-salmeterol (ADVAIR DISKUS) 500-50 MCG/ACT AEPB    Sig: Inhale 1 puff into the lungs in the morning and at bedtime.    Dispense:  60 each    Refill:  11      Follow-up: Return in about 2 weeks (around 08/02/2022) for For PFT.  Claretta Fraise, M.D.

## 2022-07-20 LAB — CBC WITH DIFFERENTIAL/PLATELET
Basophils Absolute: 0.1 10*3/uL (ref 0.0–0.2)
Basos: 2 %
EOS (ABSOLUTE): 1.7 10*3/uL — ABNORMAL HIGH (ref 0.0–0.4)
Eos: 23 %
Hematocrit: 43.7 % (ref 34.0–46.6)
Hemoglobin: 14.6 g/dL (ref 11.1–15.9)
Immature Grans (Abs): 0 10*3/uL (ref 0.0–0.1)
Immature Granulocytes: 0 %
Lymphocytes Absolute: 2 10*3/uL (ref 0.7–3.1)
Lymphs: 28 %
MCH: 29.6 pg (ref 26.6–33.0)
MCHC: 33.4 g/dL (ref 31.5–35.7)
MCV: 89 fL (ref 79–97)
Monocytes Absolute: 0.6 10*3/uL (ref 0.1–0.9)
Monocytes: 8 %
Neutrophils Absolute: 2.8 10*3/uL (ref 1.4–7.0)
Neutrophils: 39 %
Platelets: 343 10*3/uL (ref 150–450)
RBC: 4.93 x10E6/uL (ref 3.77–5.28)
RDW: 12.2 % (ref 11.7–15.4)
WBC: 7.1 10*3/uL (ref 3.4–10.8)

## 2022-07-20 LAB — CMP14+EGFR
ALT: 15 IU/L (ref 0–32)
AST: 17 IU/L (ref 0–40)
Albumin/Globulin Ratio: 1.8 (ref 1.2–2.2)
Albumin: 4.6 g/dL (ref 3.9–4.9)
Alkaline Phosphatase: 63 IU/L (ref 44–121)
BUN/Creatinine Ratio: 12 (ref 12–28)
BUN: 9 mg/dL (ref 8–27)
Bilirubin Total: 0.4 mg/dL (ref 0.0–1.2)
CO2: 19 mmol/L — ABNORMAL LOW (ref 20–29)
Calcium: 9.6 mg/dL (ref 8.7–10.3)
Chloride: 104 mmol/L (ref 96–106)
Creatinine, Ser: 0.78 mg/dL (ref 0.57–1.00)
Globulin, Total: 2.6 g/dL (ref 1.5–4.5)
Glucose: 94 mg/dL (ref 70–99)
Potassium: 4.3 mmol/L (ref 3.5–5.2)
Sodium: 140 mmol/L (ref 134–144)
Total Protein: 7.2 g/dL (ref 6.0–8.5)
eGFR: 84 mL/min/{1.73_m2} (ref >59)

## 2022-07-20 LAB — LIPID PANEL
Chol/HDL Ratio: 2.1 ratio (ref 0.0–4.4)
Cholesterol, Total: 193 mg/dL (ref 100–199)
HDL: 91 mg/dL (ref >39)
LDL Chol Calc (NIH): 85 mg/dL (ref 0–99)
Triglycerides: 100 mg/dL (ref 0–149)
VLDL Cholesterol Cal: 17 mg/dL (ref 5–40)

## 2022-07-20 LAB — TSH+FREE T4
Free T4: 1.65 ng/dL (ref 0.82–1.77)
TSH: 2.87 u[IU]/mL (ref 0.450–4.500)

## 2022-07-24 NOTE — Progress Notes (Signed)
Hello Joreen,  Your lab result is normal and/or stable.Some minor variations that are not significant are commonly marked abnormal, but do not represent any medical problem for you.  Best regards, Roslind Michaux, M.D.

## 2022-09-03 ENCOUNTER — Inpatient Hospital Stay: Admission: RE | Admit: 2022-09-03 | Payer: Self-pay | Source: Ambulatory Visit

## 2022-09-06 IMAGING — RF DG C-ARM 1-60 MIN
1 series · 2 of 2 positions shown · non-contrast
Comparison: March 19, 2021.

CLINICAL DATA: L1, L2, L3 kyphoplasty.

EXAM:
DG C-ARM 1-60 MIN; LUMBAR SPINE - 2-3 VIEW
FLUOROSCOPY TIME:  Fluoroscopy Time:  5 minutes and 17 seconds.
Radiation Exposure Index (if provided by the fluoroscopic device):
106.41 mGy.
Number of Acquired Spot Images: 2

[Series 1: run · 2 of 2 slices shown]
[im 1/2]
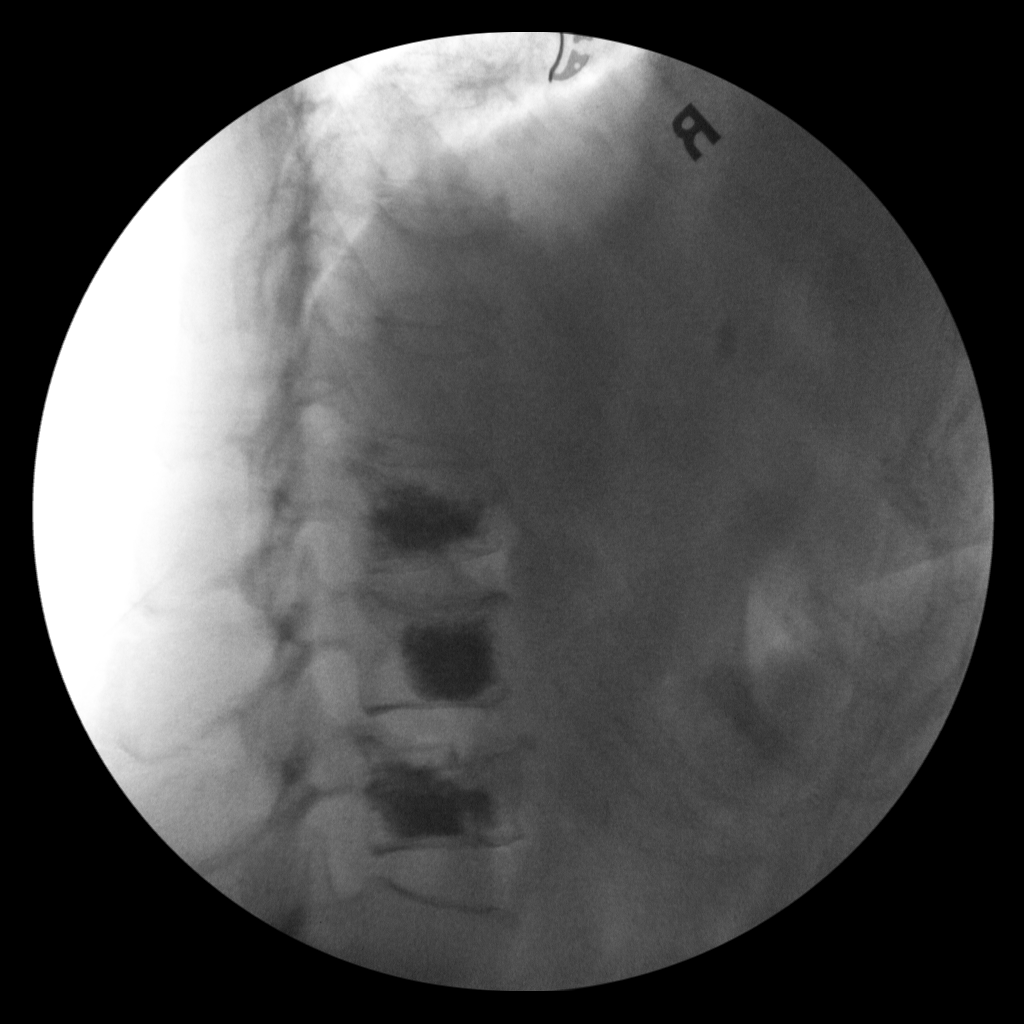
[im 2/2]
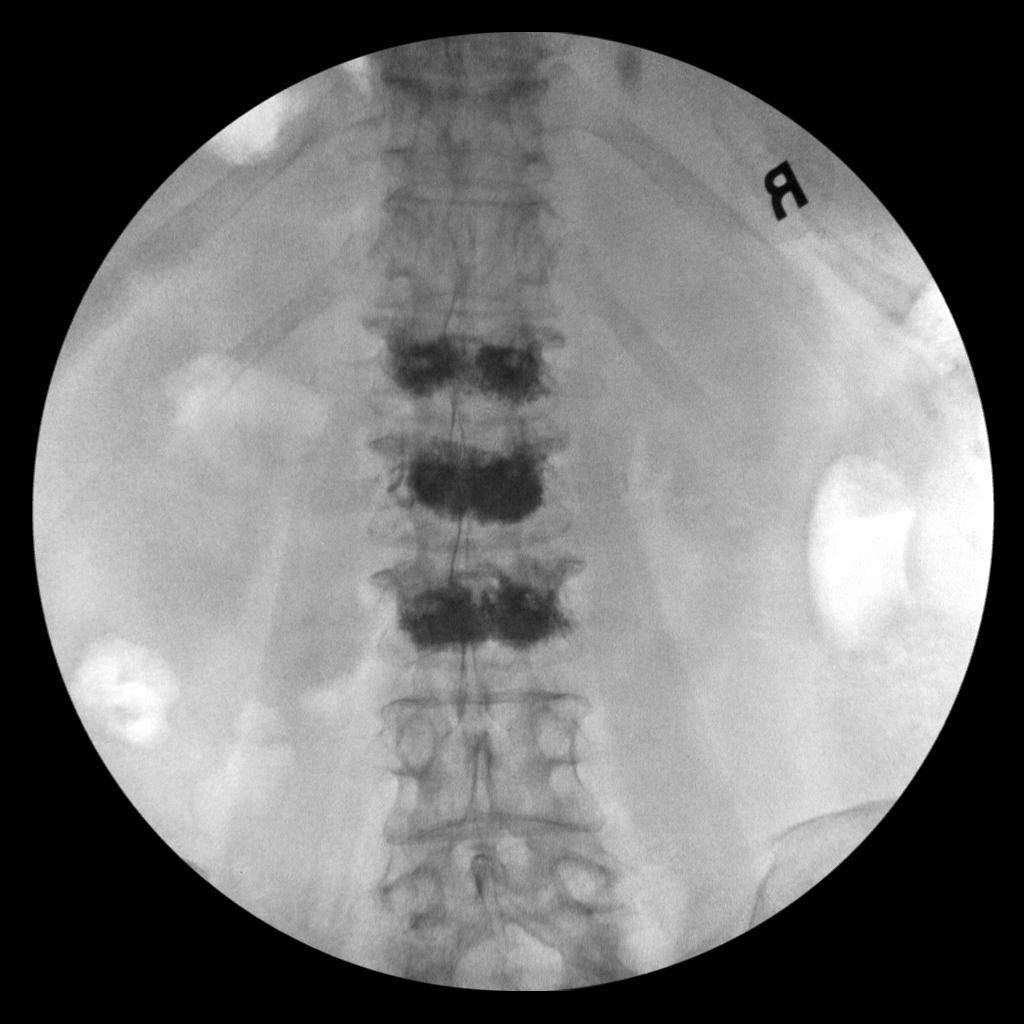

[2 of 2 positions shown; findings below may reference images not displayed]

FINDINGS: Two C-arm fluoroscopic images were obtained intraoperatively and
submitted for post operative interpretation. These images
demonstrate vertebral augmentation at L1, L2, and L3. No appreciable
extravasation of cement. No unexpected findings. Please see the
performing provider's procedural report for further detail.
IMPRESSION: Intraoperative fluoroscopy, as detailed above.

## 2022-09-24 ENCOUNTER — Ambulatory Visit
Admission: RE | Admit: 2022-09-24 | Discharge: 2022-09-24 | Disposition: A | Discharge status: Home / Self Care | Payer: Medicare Other | Source: Ambulatory Visit | Attending: Family Medicine | Admitting: Family Medicine

## 2022-09-24 DIAGNOSIS — Z1231 Encounter for screening mammogram for malignant neoplasm of breast: Secondary | ICD-10-CM | POA: Diagnosis not present

## 2022-10-19 DIAGNOSIS — J029 Acute pharyngitis, unspecified: Secondary | ICD-10-CM | POA: Diagnosis not present

## 2022-10-19 DIAGNOSIS — R509 Fever, unspecified: Secondary | ICD-10-CM | POA: Diagnosis not present

## 2022-10-19 DIAGNOSIS — R062 Wheezing: Secondary | ICD-10-CM | POA: Diagnosis not present

## 2022-10-19 DIAGNOSIS — Z8709 Personal history of other diseases of the respiratory system: Secondary | ICD-10-CM | POA: Diagnosis not present

## 2022-11-21 DIAGNOSIS — R509 Fever, unspecified: Secondary | ICD-10-CM | POA: Diagnosis not present

## 2022-11-21 DIAGNOSIS — J329 Chronic sinusitis, unspecified: Secondary | ICD-10-CM | POA: Diagnosis not present

## 2022-11-21 DIAGNOSIS — R059 Cough, unspecified: Secondary | ICD-10-CM | POA: Diagnosis not present

## 2022-11-21 DIAGNOSIS — R0981 Nasal congestion: Secondary | ICD-10-CM | POA: Diagnosis not present

## 2022-12-17 ENCOUNTER — Telehealth: Payer: Self-pay | Admitting: Family Medicine

## 2022-12-17 NOTE — Telephone Encounter (Signed)
Contacted Natalie Valencia to schedule their annual wellness visit. Appointment made for 12/19/2022.  Thank you,  [place] ??[phone]

## 2022-12-18 NOTE — Progress Notes (Unsigned)
Subjective:   Natalie Valencia is a 67 y.o. female who presents for an Initial Medicare Annual Wellness Visit.  Review of Systems    ***       Objective:    There were no vitals filed for this visit. There is no height or weight on file to calculate BMI.     05/05/2021   12:56 PM 03/21/2021    8:18 AM  Advanced Directives  Does Patient Have a Medical Advance Directive? No No  Would patient like information on creating a medical advance directive?  Yes (MAU/Ambulatory/Procedural Areas - Information given)    Current Medications (verified) Outpatient Encounter Medications as of 12/19/2022  Medication Sig   acetaminophen (TYLENOL) 500 MG tablet Take 1,000 mg by mouth every 8 (eight) hours as needed for moderate pain.   albuterol (VENTOLIN HFA) 108 (90 Base) MCG/ACT inhaler Inhale 2 puffs into the lungs every 4 (four) hours as needed for wheezing or shortness of breath.   AMBULATORY NON FORMULARY MEDICATION Allergy Shots Take 1 injection every 2 weeks   benzonatate (TESSALON) 100 MG capsule Take 100 mg by mouth 3 (three) times daily as needed for cough.   calcium-vitamin D (OSCAL WITH D) 500-200 MG-UNIT tablet Take 2 tablets by mouth.   docusate sodium (COLACE) 100 MG capsule Take 100 mg by mouth as needed for mild constipation.   fluticasone-salmeterol (ADVAIR DISKUS) 500-50 MCG/ACT AEPB Inhale 1 puff into the lungs in the morning and at bedtime.   ibuprofen (ADVIL) 100 MG tablet Take 200 mg by mouth as needed for fever.   levothyroxine (SYNTHROID) 50 MCG tablet Take 1 tablet (50 mcg total) by mouth daily.   oxymetazoline (AFRIN) 0.05 % nasal spray Place 1 spray into both nostrils 2 (two) times daily as needed for congestion.   pantoprazole (PROTONIX) 40 MG tablet Take 1 tablet (40 mg total) by mouth daily.   pseudoephedrine-acetaminophen (TYLENOL SINUS) 30-500 MG TABS tablet Take 1 tablet by mouth every 4 (four) hours as needed (congestion).   raloxifene (EVISTA) 60 MG tablet Take 1  tablet (60 mg total) by mouth daily. For bone health   rosuvastatin (CRESTOR) 5 MG tablet Take 1 tablet (5 mg total) by mouth daily. For cholesterol   vitamin B-12 (CYANOCOBALAMIN) 500 MCG tablet Take 1,000 mcg by mouth daily.   Facility-Administered Encounter Medications as of 12/19/2022  Medication   zoledronic acid (RECLAST) injection 5 mg    Allergies (verified) Patient has no known allergies.   History: Past Medical History:  Diagnosis Date   Allergy    Asthma    Back pain    GERD (gastroesophageal reflux disease)    Hyperlipidemia    borderline diet controlled   Hypertension    situational no meds   Hypothyroidism    Osteopenia    Thyroid disease    Past Surgical History:  Procedure Laterality Date   ABDOMINAL HYSTERECTOMY  2001   COLONOSCOPY     DILATION AND CURETTAGE OF UTERUS     KYPHOPLASTY N/A 03/22/2021   Procedure: KYPHOPLASTY LUMBAR ONE, LUMBAR TWO and LUMBAR THREE;  Surgeon: Melina Schools, MD;  Location: [facility];  Service: Orthopedics;  Laterality: N/A;   NASAL SINUS SURGERY     UPPER GASTROINTESTINAL ENDOSCOPY     Family History  Problem Relation Age of Onset   Cancer Mother        female region- unknown   Diabetes Father    Stroke Paternal Grandmother    Alcoholism Maternal Grandfather  Colon cancer Neg Hx    Esophageal cancer Neg Hx    Stomach cancer Neg Hx    Rectal cancer Neg Hx    Social History   Socioeconomic History   Marital status: Married    Spouse name: Not on file   Number of children: 2   Years of education: Not on file   Highest education level: Not on file  Occupational History   Occupation: assembler  Tobacco Use   Smoking status: Never   Smokeless tobacco: Never  Vaping Use   Vaping Use: Never used  Substance and Sexual Activity   Alcohol use: No   Drug use: No   Sexual activity: Not on file  Other Topics Concern   Not on file  Social History Narrative   Not on file   Social Determinants of Health   Financial  Resource Strain: Not on file  Food Insecurity: Not on file  Transportation Needs: Not on file  Physical Activity: Not on file  Stress: Not on file  Social Connections: Not on file    Tobacco Counseling Counseling given: Not Answered   Clinical Intake:                 Diabetic?No          Activities of Daily Living     No data to display          Patient Care Team: Claretta Fraise, MD as PCP - General (Family Medicine)  Indicate any recent Medical Services you may have received from other than [facility] providers in the past year (date may be approximate).     Assessment:   This is a routine wellness examination for [place].  Hearing/Vision screen No results found.  Dietary issues and exercise activities discussed:     Goals Addressed   None    Depression Screen    07/19/2022    8:53 AM 01/17/2022    8:14 AM 01/17/2022    8:05 AM 07/20/2021    1:50 PM 02/27/2021    1:54 PM 02/21/2021    9:36 AM 01/17/2021    1:00 PM  PHQ 2/9 Scores  PHQ - 2 Score 0 0 0 0 0 0 0  PHQ- 9 Score  2         Fall Risk    07/19/2022    8:53 AM 01/17/2022    8:14 AM 07/20/2021    1:50 PM 02/27/2021    1:54 PM 02/21/2021    9:36 AM  Fall Risk   Falls in the past year? 0 1 0 0 0  Number falls in past yr:  0     Injury with Fall?  1     Risk for fall due to :  History of fall(s)     Follow up  Falls evaluation completed       [place]:  Any stairs in or around the home? {YES/NO:21197} If so, are there any without handrails? {YES/NO:21197} Home free of loose throw rugs in walkways, pet beds, electrical cords, etc? {YES/NO:21197} Adequate lighting in your home to reduce risk of falls? {YES/NO:21197}  ASSISTIVE DEVICES UTILIZED TO PREVENT FALLS:  Life alert? {YES/NO:21197} Use of a cane, walker or w/c? {YES/NO:21197} Grab bars in the bathroom? {YES/NO:21197} Shower chair or bench in shower? {YES/NO:21197} Elevated toilet seat or a  handicapped toilet? {YES/NO:21197}  TIMED UP AND GO:  Was the test performed? No . Telephonic visit   Cognitive Function:  Immunizations Immunization History  Administered Date(s) Administered   Influenza,inj,Quad PF,6+ Mos 11/23/2016, 08/28/2019   PNEUMOCOCCAL CONJUGATE-20 07/27/2021   Pneumococcal Polysaccharide-23 11/23/2016   Tdap 06/20/2018   Zoster Recombinat (Shingrix) 03/21/2017, 06/21/2017    TDAP status: Up to date  {Flu Vaccine status:2101806}  Pneumococcal vaccine status: Up to date  Covid-19 vaccine status: Information provided on how to obtain vaccines.   Qualifies for Shingles Vaccine? Yes   Zostavax completed No   Shingrix Completed?: Yes  Screening Tests Health Maintenance  Topic Date Due   Medicare Annual Wellness (AWV)  Never done   COVID-19 Vaccine (1) Never done   INFLUENZA VACCINE  01/27/2023 (Originally 05/29/2022)   MAMMOGRAM  09/24/2024   COLONOSCOPY (Pts 45-71yr Insurance coverage will need to be confirmed)  03/21/2028   DTaP/Tdap/Td (2 - Td or Tdap) 06/20/2028   Pneumonia Vaccine 67 Years old  Completed   DEXA SCAN  Completed   Hepatitis C Screening  Completed   Zoster Vaccines- Shingrix  Completed   HPV VACCINES  Aged Out    Health Maintenance  Health Maintenance Due  Topic Date Due   Medicare Annual Wellness (AWV)  Never done   COVID-19 Vaccine (1) Never done    Colorectal cancer screening: Type of screening: Colonoscopy. Completed 03/21/18. Repeat every 10 years  Mammogram status: Completed 09/24/22. Repeat every year  Bone Density status: Completed 01/25/22. Results reflect: Bone density results: OSTEOPOROSIS. Repeat every 2 years.  Lung Cancer Screening: (Low Dose CT Chest recommended if Age 67-80years, 30 pack-year currently smoking OR have quit w/in 15years.) does not qualify.   Lung Cancer Screening Referral: n/a  Additional Screening:  Hepatitis C Screening: does qualify; Completed 11/23/16  Vision  Screening: Recommended annual ophthalmology exams for early detection of glaucoma and other disorders of the eye. Is the patient up to date with their annual eye exam?  {YES/NO:21197} Who is the provider or what is the name of the office in which the patient attends annual eye exams? *** If pt is not established with a provider, would they like to be referred to a provider to establish care? {YES/NO:21197}.   Dental Screening: Recommended annual dental exams for proper oral hygiene  Community Resource Referral / Chronic Care Management: CRR required this visit?  {YES/NO:21197}  CCM required this visit?  {YES/NO:21197}     Plan:     I have personally reviewed and noted the following in the patient's chart:   Medical and social history Use of alcohol, tobacco or illicit drugs  Current medications and supplements including opioid prescriptions. {Opioid Prescriptions:[phone]} Functional ability and status Nutritional status Physical activity Advanced directives List of other physicians Hospitalizations, surgeries, and ER visits in previous 12 months Vitals Screenings to include cognitive, depression, and falls Referrals and appointments  In addition, I have reviewed and discussed with patient certain preventive protocols, quality metrics, and best practice recommendations. A written personalized care plan for preventive services as well as general preventive health recommendations were provided to patient.     [place]  [id number]  Due to this being a virtual visit, the after visit summary with patients personalized plan was offered to patient via mail or my-chart. ***Patient declined at this time./ Patient would like to access on my-chart/ per request, patient was mailed a copy of AVS./ Patient preferred to pick up at office at next visit   Nurse Notes: ***

## 2022-12-18 NOTE — Patient Instructions (Signed)
Natalie Valencia , Thank you for taking time to come for your Medicare Wellness Visit. I appreciate your ongoing commitment to your health goals. Please review the following plan we discussed and let me know if I can assist you in the future.   These are the goals we discussed:  Goals   None     This is a list of the screening recommended for you and due dates:  Health Maintenance  Topic Date Due   Medicare Annual Wellness Visit  Never done   COVID-19 Vaccine (1) Never done   Flu Shot  01/27/2023*   Mammogram  09/24/2024   Colon Cancer Screening  03/21/2028   DTaP/Tdap/Td vaccine (2 - Td or Tdap) 06/20/2028   Pneumonia Vaccine  Completed   DEXA scan (bone density measurement)  Completed   Hepatitis C Screening: USPSTF Recommendation to screen - Ages 18-79 yo.  Completed   Zoster (Shingles) Vaccine  Completed   HPV Vaccine  Aged Out  *Topic was postponed. The date shown is not the original due date.    Advanced directives: ***  Conditions/risks identified: Aim for 30 minutes of exercise or brisk walking, 6-8 glasses of water, and 5 servings of fruits and vegetables each day.   Next appointment: Follow up in one year for your annual wellness visit    Preventive Care 65 Years and Older, Female Preventive care refers to lifestyle choices and visits with your health care provider that can promote health and wellness. What does preventive care include? A yearly physical exam. This is also called an annual well check. Dental exams once or twice a year. Routine eye exams. Ask your health care provider how often you should have your eyes checked. Personal lifestyle choices, including: Daily care of your teeth and gums. Regular physical activity. Eating a healthy diet. Avoiding tobacco and drug use. Limiting alcohol use. Practicing safe sex. Taking low-dose aspirin every day. Taking vitamin and mineral supplements as recommended by your health care provider. What happens during an  annual well check? The services and screenings done by your health care provider during your annual well check will depend on your age, overall health, lifestyle risk factors, and family history of disease. Counseling  Your health care provider may ask you questions about your: Alcohol use. Tobacco use. Drug use. Emotional well-being. Home and relationship well-being. Sexual activity. Eating habits. History of falls. Memory and ability to understand (cognition). Work and work Statistician. Reproductive health. Screening  You may have the following tests or measurements: Height, weight, and BMI. Blood pressure. Lipid and cholesterol levels. These may be checked every 5 years, or more frequently if you are over 35 years old. Skin check. Lung cancer screening. You may have this screening every year starting at age 77 if you have a 30-pack-year history of smoking and currently smoke or have quit within the past 15 years. Fecal occult blood test (FOBT) of the stool. You may have this test every year starting at age 21. Flexible sigmoidoscopy or colonoscopy. You may have a sigmoidoscopy every 5 years or a colonoscopy every 10 years starting at age 39. Hepatitis C blood test. Hepatitis B blood test. Sexually transmitted disease (STD) testing. Diabetes screening. This is done by checking your blood sugar (glucose) after you have not eaten for a while (fasting). You may have this done every 1-3 years. Bone density scan. This is done to screen for osteoporosis. You may have this done starting at age 46. Mammogram. This may be done  every 1-2 years. Talk to your health care provider about how often you should have regular mammograms. Talk with your health care provider about your test results, treatment options, and if necessary, the need for more tests. Vaccines  Your health care provider may recommend certain vaccines, such as: Influenza vaccine. This is recommended every year. Tetanus,  diphtheria, and acellular pertussis (Tdap, Td) vaccine. You may need a Td booster every 10 years. Zoster vaccine. You may need this after age 50. Pneumococcal 13-valent conjugate (PCV13) vaccine. One dose is recommended after age 6. Pneumococcal polysaccharide (PPSV23) vaccine. One dose is recommended after age 4. Talk to your health care provider about which screenings and vaccines you need and how often you need them. This information is not intended to replace advice given to you by your health care provider. Make sure you discuss any questions you have with your health care provider. Document Released: 11/11/2015 Document Revised: 07/04/2016 Document Reviewed: 08/16/2015 Elsevier Interactive Patient Education  2017 [employer] Prevention in the Home Falls can cause injuries. They can happen to people of all ages. There are many things you can do to make your home safe and to help prevent falls. What can I do on the outside of my home? Regularly fix the edges of walkways and driveways and fix any cracks. Remove anything that might make you trip as you walk through a door, such as a raised step or threshold. Trim any bushes or trees on the path to your home. Use bright outdoor lighting. Clear any walking paths of anything that might make someone trip, such as rocks or tools. Regularly check to see if handrails are loose or broken. Make sure that both sides of any steps have handrails. Any raised decks and porches should have guardrails on the edges. Have any leaves, snow, or ice cleared regularly. Use sand or salt on walking paths during winter. Clean up any spills in your garage right away. This includes oil or grease spills. What can I do in the bathroom? Use night lights. Install grab bars by the toilet and in the tub and shower. Do not use towel bars as grab bars. Use non-skid mats or decals in the tub or shower. If you need to sit down in the shower, use a plastic,  non-slip stool. Keep the floor dry. Clean up any water that spills on the floor as soon as it happens. Remove soap buildup in the tub or shower regularly. Attach bath mats securely with double-sided non-slip rug tape. Do not have throw rugs and other things on the floor that can make you trip. What can I do in the bedroom? Use night lights. Make sure that you have a light by your bed that is easy to reach. Do not use any sheets or blankets that are too big for your bed. They should not hang down onto the floor. Have a firm chair that has side arms. You can use this for support while you get dressed. Do not have throw rugs and other things on the floor that can make you trip. What can I do in the kitchen? Clean up any spills right away. Avoid walking on wet floors. Keep items that you use a lot in easy-to-reach places. If you need to reach something above you, use a strong step stool that has a grab bar. Keep electrical cords out of the way. Do not use floor polish or wax that makes floors slippery. If you must use wax, use  non-skid floor wax. Do not have throw rugs and other things on the floor that can make you trip. What can I do with my stairs? Do not leave any items on the stairs. Make sure that there are handrails on both sides of the stairs and use them. Fix handrails that are broken or loose. Make sure that handrails are as long as the stairways. Check any carpeting to make sure that it is firmly attached to the stairs. Fix any carpet that is loose or worn. Avoid having throw rugs at the top or bottom of the stairs. If you do have throw rugs, attach them to the floor with carpet tape. Make sure that you have a light switch at the top of the stairs and the bottom of the stairs. If you do not have them, ask someone to add them for you. What else can I do to help prevent falls? Wear shoes that: Do not have high heels. Have rubber bottoms. Are comfortable and fit you well. Are closed  at the toe. Do not wear sandals. If you use a stepladder: Make sure that it is fully opened. Do not climb a closed stepladder. Make sure that both sides of the stepladder are locked into place. Ask someone to hold it for you, if possible. Clearly mark and make sure that you can see: Any grab bars or handrails. First and last steps. Where the edge of each step is. Use tools that help you move around (mobility aids) if they are needed. These include: Canes. Walkers. Scooters. Crutches. Turn on the lights when you go into a dark area. Replace any light bulbs as soon as they burn out. Set up your furniture so you have a clear path. Avoid moving your furniture around. If any of your floors are uneven, fix them. If there are any pets around you, be aware of where they are. Review your medicines with your doctor. Some medicines can make you feel dizzy. This can increase your chance of falling. Ask your doctor what other things that you can do to help prevent falls. This information is not intended to replace advice given to you by your health care provider. Make sure you discuss any questions you have with your health care provider. Document Released: 08/11/2009 Document Revised: 03/22/2016 Document Reviewed: 11/19/2014 Elsevier Interactive Patient Education  2017 [employer].

## 2022-12-19 ENCOUNTER — Ambulatory Visit (INDEPENDENT_AMBULATORY_CARE_PROVIDER_SITE_OTHER): Payer: Medicare Other

## 2022-12-19 VITALS — Ht 68.0 in | Wt 157.0 lb

## 2022-12-19 DIAGNOSIS — Z0000 Encounter for general adult medical examination without abnormal findings: Secondary | ICD-10-CM

## 2023-01-23 ENCOUNTER — Encounter: Payer: Self-pay | Admitting: Family Medicine

## 2023-01-23 ENCOUNTER — Ambulatory Visit (INDEPENDENT_AMBULATORY_CARE_PROVIDER_SITE_OTHER): Payer: Medicare Other | Admitting: Family Medicine

## 2023-01-23 VITALS — BP 115/61 | HR 77 | Temp 97.6°F | Ht 68.0 in | Wt 162.0 lb

## 2023-01-23 DIAGNOSIS — K2990 Gastroduodenitis, unspecified, without bleeding: Secondary | ICD-10-CM

## 2023-01-23 DIAGNOSIS — E039 Hypothyroidism, unspecified: Secondary | ICD-10-CM

## 2023-01-23 DIAGNOSIS — K2970 Gastritis, unspecified, without bleeding: Secondary | ICD-10-CM | POA: Diagnosis not present

## 2023-01-23 DIAGNOSIS — M810 Age-related osteoporosis without current pathological fracture: Secondary | ICD-10-CM | POA: Diagnosis not present

## 2023-01-23 DIAGNOSIS — E785 Hyperlipidemia, unspecified: Secondary | ICD-10-CM | POA: Diagnosis not present

## 2023-01-23 MED ORDER — ROSUVASTATIN CALCIUM 5 MG PO TABS: 5.0000 mg | ORAL_TABLET | Freq: Every day | ORAL | 3 refills | Status: DC

## 2023-01-23 MED ORDER — PANTOPRAZOLE SODIUM 40 MG PO TBEC: 40.0000 mg | DELAYED_RELEASE_TABLET | Freq: Every day | ORAL | 3 refills | Status: DC

## 2023-01-23 MED ORDER — FLUTICASONE-SALMETEROL 500-50 MCG/ACT IN AEPB: 1.0000 | INHALATION_SPRAY | Freq: Two times a day (BID) | RESPIRATORY_TRACT | 11 refills | Status: DC

## 2023-01-23 MED ORDER — RALOXIFENE HCL 60 MG PO TABS: 60.0000 mg | ORAL_TABLET | Freq: Every day | ORAL | 3 refills | Status: DC

## 2023-01-23 MED ORDER — LEVOTHYROXINE SODIUM 50 MCG PO TABS: 50.0000 ug | ORAL_TABLET | Freq: Every day | ORAL | 3 refills | Status: DC

## 2023-01-23 NOTE — Progress Notes (Signed)
Subjective:  Patient ID: Natalie Valencia, female    DOB: January 09, 1956  Age: 67 y.o. MRN: [id number]  CC: Medical Management of Chronic Issues   HPI Natalie Valencia presents for  follow-up of  thyroid. The patient has a history of hypothyroidism for many years. It has been stable recently. Pt. denies any change in  voice, loss of hair, heat or cold intolerance. Energy level has been adequate to good. Patient denies constipation and diarrhea. No myxedema. Medication is as noted below. Verified that pt is taking it daily on an empty stomach. Well tolerated.  Patient in for follow-up of GERD. Currently asymptomatic taking  PPI daily. There is no chest pain or heartburn. No hematemesis and no melena. No dysphagia or choking. Onset is remote. Progression is stable. Complicating factors, none.  Has osteoporosis. Taking reclast annually plus raloxifene. Also taking Caltrate. Not sure of the Vitamin D content.  History Natalie Valencia has a past medical history of Allergy, Asthma, Back pain, GERD (gastroesophageal reflux disease), Hyperlipidemia, Hypertension, Hypothyroidism, Osteopenia, and Thyroid disease.   She has a past surgical history that includes Abdominal hysterectomy (2001); Nasal sinus surgery; Colonoscopy; Upper gastrointestinal endoscopy; Dilation and curettage of uterus; and Kyphoplasty (N/A, 03/22/2021).   Her family history includes Alcoholism in her maternal grandfather; Cancer in her mother; Diabetes in her father; Stroke in her paternal grandmother.She reports that she has never smoked. She has never used smokeless tobacco. She reports that she does not drink alcohol and does not use drugs.  Current Outpatient Medications on File Prior to Visit  Medication Sig Dispense Refill   acetaminophen (TYLENOL) 500 MG tablet Take 1,000 mg by mouth every 8 (eight) hours as needed for moderate pain.     albuterol (VENTOLIN HFA) 108 (90 Base) MCG/ACT inhaler Inhale 2 puffs into the lungs every 4 (four) hours  as needed for wheezing or shortness of breath.     docusate sodium (COLACE) 100 MG capsule Take 100 mg by mouth as needed for mild constipation.     ibuprofen (ADVIL) 100 MG tablet Take 200 mg by mouth as needed for fever.     pseudoephedrine-acetaminophen (TYLENOL SINUS) 30-500 MG TABS tablet Take 1 tablet by mouth every 4 (four) hours as needed (congestion).     vitamin B-12 (CYANOCOBALAMIN) 500 MCG tablet Take 1,000 mcg by mouth daily.     Current Facility-Administered Medications on File Prior to Visit  Medication Dose Route Frequency Provider Last Rate Last Admin   zoledronic acid (RECLAST) injection 5 mg  5 mg Intravenous Once Claretta Fraise, MD        ROS Review of Systems  Constitutional: Negative.   HENT: Negative.    Eyes:  Negative for visual disturbance.  Respiratory:  Negative for shortness of breath.   Cardiovascular:  Negative for chest pain.  Gastrointestinal:  Negative for abdominal pain.  Musculoskeletal:  Negative for arthralgias.    Objective:  BP 115/61   Pulse 77   Temp 97.6 F (36.4 C)   Ht 5\' 8"  (1.727 m)   Wt 162 lb (73.5 kg)   SpO2 96%   BMI 24.63 kg/m   BP Readings from Last 3 Encounters:  01/23/23 115/61  07/19/22 128/71  05/24/22 122/74    Wt Readings from Last 3 Encounters:  01/23/23 162 lb (73.5 kg)  12/19/22 157 lb (71.2 kg)  07/19/22 157 lb 3.2 oz (71.3 kg)     Physical Exam Constitutional:      General: She is not in acute distress.  Appearance: She is well-developed.  Cardiovascular:     Rate and Rhythm: Normal rate and regular rhythm.  Pulmonary:     Breath sounds: Normal breath sounds.  Musculoskeletal:        General: Normal range of motion.  Skin:    General: Skin is warm and dry.  Neurological:     Mental Status: She is alert and oriented to person, place, and time.       Assessment & Plan:   Natalie Valencia was seen today for medical management of chronic issues.  Diagnoses and all orders for this  visit:  Acquired hypothyroidism -     CBC with Differential/Platelet -     CMP14+EGFR -     TSH + free T4 -     levothyroxine (SYNTHROID) 50 MCG tablet; Take 1 tablet (50 mcg total) by mouth daily.  Hyperlipidemia with target LDL less than 100 -     CBC with Differential/Platelet -     CMP14+EGFR -     Lipid panel  Age-related osteoporosis without current pathological fracture -     CBC with Differential/Platelet -     CMP14+EGFR -     raloxifene (EVISTA) 60 MG tablet; Take 1 tablet (60 mg total) by mouth daily. For bone health -     VITAMIN D 25 Hydroxy (Vit-D Deficiency, Fractures)  Gastritis and gastroduodenitis -     CBC with Differential/Platelet -     CMP14+EGFR -     pantoprazole (PROTONIX) 40 MG tablet; Take 1 tablet (40 mg total) by mouth daily.  Other orders -     rosuvastatin (CRESTOR) 5 MG tablet; Take 1 tablet (5 mg total) by mouth daily. For cholesterol -     fluticasone-salmeterol (ADVAIR DISKUS) 500-50 MCG/ACT AEPB; Inhale 1 puff into the lungs in the morning and at bedtime.   Allergies as of 01/23/2023   No Known Allergies      Medication List        Accurate as of January 23, 2023  9:37 AM. If you have any questions, ask your nurse or doctor.          STOP taking these medications    AMBULATORY NON FORMULARY MEDICATION Stopped by: Claretta Fraise, MD   benzonatate 100 MG capsule Commonly known as: TESSALON Stopped by: Claretta Fraise, MD   calcium-vitamin D 500-200 MG-UNIT tablet Commonly known as: OSCAL WITH D Stopped by: Claretta Fraise, MD   oxymetazoline 0.05 % nasal spray Commonly known as: AFRIN Stopped by: Claretta Fraise, MD       TAKE these medications    acetaminophen 500 MG tablet Commonly known as: TYLENOL Take 1,000 mg by mouth every 8 (eight) hours as needed for moderate pain.   albuterol 108 (90 Base) MCG/ACT inhaler Commonly known as: VENTOLIN HFA Inhale 2 puffs into the lungs every 4 (four) hours as needed for wheezing or  shortness of breath.   cyanocobalamin 500 MCG tablet Commonly known as: VITAMIN B12 Take 1,000 mcg by mouth daily.   docusate sodium 100 MG capsule Commonly known as: COLACE Take 100 mg by mouth as needed for mild constipation.   fluticasone-salmeterol 500-50 MCG/ACT Aepb Commonly known as: Advair Diskus Inhale 1 puff into the lungs in the morning and at bedtime.   ibuprofen 100 MG tablet Commonly known as: ADVIL Take 200 mg by mouth as needed for fever.   levothyroxine 50 MCG tablet Commonly known as: SYNTHROID Take 1 tablet (50 mcg total) by mouth daily.  pantoprazole 40 MG tablet Commonly known as: PROTONIX Take 1 tablet (40 mg total) by mouth daily.   pseudoephedrine-acetaminophen 30-500 MG Tabs tablet Commonly known as: TYLENOL SINUS Take 1 tablet by mouth every 4 (four) hours as needed (congestion).   raloxifene 60 MG tablet Commonly known as: Evista Take 1 tablet (60 mg total) by mouth daily. For bone health   rosuvastatin 5 MG tablet Commonly known as: Crestor Take 1 tablet (5 mg total) by mouth daily. For cholesterol        Meds ordered this encounter  Medications   levothyroxine (SYNTHROID) 50 MCG tablet    Sig: Take 1 tablet (50 mcg total) by mouth daily.    Dispense:  90 tablet    Refill:  3   raloxifene (EVISTA) 60 MG tablet    Sig: Take 1 tablet (60 mg total) by mouth daily. For bone health    Dispense:  90 tablet    Refill:  3   rosuvastatin (CRESTOR) 5 MG tablet    Sig: Take 1 tablet (5 mg total) by mouth daily. For cholesterol    Dispense:  90 tablet    Refill:  3   fluticasone-salmeterol (ADVAIR DISKUS) 500-50 MCG/ACT AEPB    Sig: Inhale 1 puff into the lungs in the morning and at bedtime.    Dispense:  60 each    Refill:  11   pantoprazole (PROTONIX) 40 MG tablet    Sig: Take 1 tablet (40 mg total) by mouth daily.    Dispense:  90 tablet    Refill:  3      Follow-up: Return in about 6 months (around 07/26/2023).  Claretta Fraise,  M.D.

## 2023-01-24 LAB — CBC WITH DIFFERENTIAL/PLATELET
Basophils Absolute: 0.1 10*3/uL (ref 0.0–0.2)
Basos: 2 %
EOS (ABSOLUTE): 1.7 10*3/uL — ABNORMAL HIGH (ref 0.0–0.4)
Eos: 25 %
Hematocrit: 41.9 % (ref 34.0–46.6)
Hemoglobin: 13.5 g/dL (ref 11.1–15.9)
Immature Grans (Abs): 0 10*3/uL (ref 0.0–0.1)
Immature Granulocytes: 0 %
Lymphocytes Absolute: 2 10*3/uL (ref 0.7–3.1)
Lymphs: 29 %
MCH: 28.9 pg (ref 26.6–33.0)
MCHC: 32.2 g/dL (ref 31.5–35.7)
MCV: 90 fL (ref 79–97)
Monocytes Absolute: 0.6 10*3/uL (ref 0.1–0.9)
Monocytes: 8 %
Neutrophils Absolute: 2.3 10*3/uL (ref 1.4–7.0)
Neutrophils: 36 %
Platelets: 363 10*3/uL (ref 150–450)
RBC: 4.67 x10E6/uL (ref 3.77–5.28)
RDW: 12.6 % (ref 11.7–15.4)
WBC: 6.6 10*3/uL (ref 3.4–10.8)

## 2023-01-24 LAB — LIPID PANEL
Chol/HDL Ratio: 2.2 ratio (ref 0.0–4.4)
Cholesterol, Total: 172 mg/dL (ref 100–199)
HDL: 77 mg/dL (ref >39)
LDL Chol Calc (NIH): 70 mg/dL (ref 0–99)
Triglycerides: 147 mg/dL (ref 0–149)
VLDL Cholesterol Cal: 25 mg/dL (ref 5–40)

## 2023-01-24 LAB — CMP14+EGFR
ALT: 15 IU/L (ref 0–32)
AST: 19 IU/L (ref 0–40)
Albumin/Globulin Ratio: 1.7 (ref 1.2–2.2)
Albumin: 4.2 g/dL (ref 3.9–4.9)
Alkaline Phosphatase: 67 IU/L (ref 44–121)
BUN/Creatinine Ratio: 10 — ABNORMAL LOW (ref 12–28)
BUN: 8 mg/dL (ref 8–27)
Bilirubin Total: 0.3 mg/dL (ref 0.0–1.2)
CO2: 22 mmol/L (ref 20–29)
Calcium: 9.5 mg/dL (ref 8.7–10.3)
Chloride: 104 mmol/L (ref 96–106)
Creatinine, Ser: 0.77 mg/dL (ref 0.57–1.00)
Globulin, Total: 2.5 g/dL (ref 1.5–4.5)
Glucose: 91 mg/dL (ref 70–99)
Potassium: 4.2 mmol/L (ref 3.5–5.2)
Sodium: 139 mmol/L (ref 134–144)
Total Protein: 6.7 g/dL (ref 6.0–8.5)
eGFR: 84 mL/min/{1.73_m2} (ref >59)

## 2023-01-24 LAB — TSH+FREE T4
Free T4: 1.33 ng/dL (ref 0.82–1.77)
TSH: 4.03 u[IU]/mL (ref 0.450–4.500)

## 2023-01-24 LAB — VITAMIN D 25 HYDROXY (VIT D DEFICIENCY, FRACTURES): Vit D, 25-Hydroxy: 70.2 ng/mL (ref 30.0–100.0)

## 2023-01-24 NOTE — Progress Notes (Signed)
Hello Alanny,  Your lab result is normal and/or stable.Some minor variations that are not significant are commonly marked abnormal, but do not represent any medical problem for you.  Best regards, Alieah Brinton, M.D.

## 2023-02-05 DIAGNOSIS — K080 Exfoliation of teeth due to systemic causes: Secondary | ICD-10-CM | POA: Diagnosis not present

## 2023-02-20 DIAGNOSIS — K080 Exfoliation of teeth due to systemic causes: Secondary | ICD-10-CM | POA: Diagnosis not present

## 2023-02-28 DIAGNOSIS — J4521 Mild intermittent asthma with (acute) exacerbation: Secondary | ICD-10-CM | POA: Diagnosis not present

## 2023-02-28 DIAGNOSIS — J0100 Acute maxillary sinusitis, unspecified: Secondary | ICD-10-CM | POA: Diagnosis not present

## 2023-04-18 ENCOUNTER — Encounter: Payer: Self-pay | Admitting: Nurse Practitioner

## 2023-04-18 ENCOUNTER — Ambulatory Visit (INDEPENDENT_AMBULATORY_CARE_PROVIDER_SITE_OTHER): Payer: Medicare Other | Admitting: Nurse Practitioner

## 2023-04-18 ENCOUNTER — Ambulatory Visit (INDEPENDENT_AMBULATORY_CARE_PROVIDER_SITE_OTHER): Payer: Medicare Other

## 2023-04-18 VITALS — BP 111/60 | HR 72 | Temp 97.8°F | Ht 68.0 in | Wt 154.6 lb

## 2023-04-18 DIAGNOSIS — M545 Low back pain, unspecified: Secondary | ICD-10-CM

## 2023-04-18 DIAGNOSIS — M8588 Other specified disorders of bone density and structure, other site: Secondary | ICD-10-CM | POA: Diagnosis not present

## 2023-04-18 DIAGNOSIS — M5137 Other intervertebral disc degeneration, lumbosacral region: Secondary | ICD-10-CM | POA: Diagnosis not present

## 2023-04-18 MED ORDER — KETOROLAC TROMETHAMINE 30 MG/ML IJ SOLN
30.0000 mg | Freq: Once | INTRAMUSCULAR | Status: AC
Start: 2023-04-18 — End: 2023-04-18
  Administered 2023-04-18: 30 mg via INTRAMUSCULAR

## 2023-04-18 NOTE — Progress Notes (Signed)
   Acute Office Visit  Subjective:     Patient ID: Natalie Valencia, female    DOB: 1956/07/21, 67 y.o.   MRN: [id number]  Chief Complaint  Patient presents with   Back Pain    Lower back pain for a week that spreads down to thigh. Has taken advil is not helping   SUBJECTIVE:  Natalie Valencia is a 67 y.o. female who complains of an injury causing low back pain last Thursday 7 day(s) ago. The pain is positional with bending or lifting, with radiation down the left legs. Mechanism of injury: unknown. Symptoms have been acute, abrupt, and intermittent since that time. Prior history of back problems: recurrent self limited episodes of low back pain in the past, previous herniated disc, and previous spinal surgery - L1-L2_l3 .She reports 9/10 when is worst, describes it stabbing pain There is no numbness in the legs. Has been using,, heat/ice with minimal  HPI  Negative unless indicated in HPI    Objective:    BP 111/60   Pulse 72   Temp 97.8 F (36.6 C) (Temporal)   Ht 5\' 8"  (1.727 m)   Wt 154 lb 9.6 oz (70.1 kg)   SpO2 95%   BMI 23.51 kg/m  BP Readings from Last 3 Encounters:  04/18/23 111/60  01/23/23 115/61  07/19/22 128/71   Wt Readings from Last 3 Encounters:  04/18/23 154 lb 9.6 oz (70.1 kg)  01/23/23 162 lb (73.5 kg)  12/19/22 157 lb (71.2 kg)      Physical Exam Vital signs as noted above. Patient appears to be in mild to moderate pain, antalgic gait noted. Lumbosacral spine area reveals no local tenderness or mass. Painful and reduced LS ROM noted. Straight leg raise is positive at 45 degrees on left, lower. DTR's, motor strength and sensation normal, including heel and toe gait.  Peripheral pulses are palpable. Lumbar spine X-Ray: no fracture or dislocation noted, pending review by radiologist.   No results found for any visits on 04/18/23.      Assessment & Plan:  Acute left-sided low back pain without sciatica -     DG Lumbar Spine 2-3 Views -     Ketorolac  Tromethamine    ASSESSMENT:  lumbar strain  PLAN: Toradol 30 mg IM injection For acute pain, rest, intermittent application of cold packs (later, may switch to heat, but do not sleep on heating pad), analgesics recommended. Discussed longer term treatment plan of prn NSAID's and discussed a home back care exercise program with flexion exercise routine. Proper lifting with avoidance of heavy lifting discussed. Consider Physical Therapy  if not improving. Call or return to clinic prn if these symptoms worsen or fail to improve as anticipated.   [address] Santa Lighter DNP

## 2023-04-25 DIAGNOSIS — J0100 Acute maxillary sinusitis, unspecified: Secondary | ICD-10-CM | POA: Diagnosis not present

## 2023-04-25 DIAGNOSIS — J029 Acute pharyngitis, unspecified: Secondary | ICD-10-CM | POA: Diagnosis not present

## 2023-04-25 DIAGNOSIS — Z8709 Personal history of other diseases of the respiratory system: Secondary | ICD-10-CM | POA: Diagnosis not present

## 2023-04-25 DIAGNOSIS — H6991 Unspecified Eustachian tube disorder, right ear: Secondary | ICD-10-CM | POA: Diagnosis not present

## 2023-05-08 ENCOUNTER — Telehealth: Payer: Self-pay | Admitting: Family Medicine

## 2023-05-08 ENCOUNTER — Other Ambulatory Visit: Payer: Self-pay | Admitting: Family Medicine

## 2023-05-08 NOTE — Telephone Encounter (Signed)
Reclast, osteoporosis

## 2023-05-08 NOTE — Telephone Encounter (Signed)
Any idea how to enter this? Nothing in the referral data base for "[facility]."

## 2023-05-08 NOTE — Telephone Encounter (Signed)
What med is being infused. For what purpose?

## 2023-05-08 NOTE — Telephone Encounter (Signed)
REFERRAL REQUEST Telephone Note  Have you been seen at our office for this problem? YES (Advise that they may need an appointment with their PCP before a referral can be done)  Reason for Referral: Infusion #3 Referral discussed with patient: YES  Best contact number of patient for referral team: [phone]    Has patient been seen by a specialist for this issue before: YES  Patient provider preference for referral: N/A Patient location preference for referral: [facility].   Patient notified that referrals can take up to a week or longer to process. If they haven't heard anything within a week they should call back and speak with the referral department.

## 2023-05-09 NOTE — Telephone Encounter (Signed)
Reclast form placed on PCP's desk

## 2023-05-10 ENCOUNTER — Other Ambulatory Visit: Payer: Self-pay

## 2023-05-16 ENCOUNTER — Encounter: Payer: Self-pay | Admitting: Family Medicine

## 2023-05-18 DIAGNOSIS — J0100 Acute maxillary sinusitis, unspecified: Secondary | ICD-10-CM | POA: Diagnosis not present

## 2023-05-18 DIAGNOSIS — R070 Pain in throat: Secondary | ICD-10-CM | POA: Diagnosis not present

## 2023-05-20 ENCOUNTER — Telehealth: Payer: Self-pay | Admitting: Pharmacy Technician

## 2023-05-20 NOTE — Telephone Encounter (Signed)
Auth Submission: NO AUTH NEEDED Site of care: Site of care: [facility] Payer: [employer] Medication & CPT/J Code(s) submitted: Reclast (Zolendronic acid) [id number] Route of submission (phone, fax, portal):  Phone # Fax # Auth type: Buy/Bill Units/visits requested: 1 Reference number:  Approval from: 05/20/23 to 10/29/23

## 2023-05-24 DIAGNOSIS — J069 Acute upper respiratory infection, unspecified: Secondary | ICD-10-CM | POA: Diagnosis not present

## 2023-05-31 ENCOUNTER — Inpatient Hospital Stay (HOSPITAL_COMMUNITY): Admission: RE | Admit: 2023-05-31 | Payer: Medicare Other | Source: Ambulatory Visit

## 2023-05-31 ENCOUNTER — Encounter: Payer: Medicare Other | Attending: Family Medicine | Admitting: Emergency Medicine

## 2023-05-31 VITALS — BP 134/68 | HR 70 | Temp 98.0°F | Resp 16

## 2023-05-31 DIAGNOSIS — M810 Age-related osteoporosis without current pathological fracture: Secondary | ICD-10-CM | POA: Diagnosis not present

## 2023-05-31 MED ORDER — ZOLEDRONIC ACID 5 MG/100ML IV SOLN
5.0000 mg | Freq: Once | INTRAVENOUS | Status: AC
  Administered 2023-05-31: 5 mg via INTRAVENOUS

## 2023-05-31 MED ORDER — DIPHENHYDRAMINE HCL 25 MG PO CAPS
25.0000 mg | ORAL_CAPSULE | Freq: Once | ORAL | Status: AC
  Administered 2023-05-31: 25 mg via ORAL

## 2023-05-31 MED ORDER — ACETAMINOPHEN 325 MG PO TABS
650.0000 mg | ORAL_TABLET | Freq: Once | ORAL | Status: AC
  Administered 2023-05-31: 650 mg via ORAL

## 2023-05-31 NOTE — Progress Notes (Signed)
Diagnosis: Iron Deficiency Anemia  Provider:   Deniece Ree MD  Procedure: IV Infusion  IV Type: Peripheral, IV Location: R Antecubital  Reclast (Zolendronic Acid), Dose: 5 mg  Infusion Start Time: 1000  Infusion Stop Time: 1035  Post Infusion IV Care: Patient declined observation and Peripheral IV Discontinued  Discharge: Condition: Good, Destination: Home . AVS Provided  Performed by:  Arrie Senate, RN

## 2023-06-03 NOTE — Progress Notes (Signed)
Diagnosis: Iron Deficiency Anemia  Provider:   Deniece Ree MD  Procedure: IV Infusion  IV Type: Peripheral, IV Location: R Antecubital  Reclast (Zolendronic Acid), Dose: 5 mg  Infusion Start Time: 1000  Infusion Stop Time: 1035  Post Infusion IV Care: Patient declined observation and Peripheral IV Discontinued  Discharge: Condition: Good, Destination: Home . AVS Provided  Performed by:  Marin Shutter, RN

## 2023-06-26 DIAGNOSIS — H5213 Myopia, bilateral: Secondary | ICD-10-CM | POA: Diagnosis not present

## 2023-07-29 ENCOUNTER — Encounter: Payer: Self-pay | Admitting: Family Medicine

## 2023-07-29 ENCOUNTER — Ambulatory Visit (INDEPENDENT_AMBULATORY_CARE_PROVIDER_SITE_OTHER): Payer: Medicare Other | Admitting: Family Medicine

## 2023-07-29 VITALS — BP 139/70 | HR 72 | Temp 97.6°F | Ht 68.0 in | Wt 162.0 lb

## 2023-07-29 DIAGNOSIS — E785 Hyperlipidemia, unspecified: Secondary | ICD-10-CM | POA: Diagnosis not present

## 2023-07-29 DIAGNOSIS — E039 Hypothyroidism, unspecified: Secondary | ICD-10-CM | POA: Diagnosis not present

## 2023-07-29 DIAGNOSIS — I10 Essential (primary) hypertension: Secondary | ICD-10-CM

## 2023-07-29 LAB — LIPID PANEL

## 2023-07-29 MED ORDER — ALBUTEROL SULFATE HFA 108 (90 BASE) MCG/ACT IN AERS: 2.0000 | INHALATION_SPRAY | RESPIRATORY_TRACT | 11 refills | Status: DC | PRN

## 2023-07-29 NOTE — Progress Notes (Signed)
Subjective:  Patient ID: Natalie Valencia, female    DOB: 31-Jan-1956  Age: 67 y.o. MRN: [id number]  CC: Medical Management of Chronic Issues   HPI Natalie Valencia presents for  follow-up of hypertension. Patient has no history of headache chest pain or shortness of breath or recent cough. Patient also denies symptoms of TIA such as focal numbness or weakness. Patient denies side effects from medication. States taking it regularly.  Tiring easily. Not getting to sleep easily. Awakening in the night going over things in her head. Raising 27 year old grand son whose mom isn't in the picture. Fighting her about learing to read. Staying with Dad. Taking Z-quil   .    07/29/2023    8:08 AM 04/18/2023   11:41 AM  GAD 7 : Generalized Anxiety Score  Nervous, Anxious, on Edge 0 0  Control/stop worrying 1 1  Worry too much - different things 1 1  Trouble relaxing 0 0  Restless 0 0  Easily annoyed or irritable 0 0  Afraid - awful might happen 0 0  Total GAD 7 Score 2 2  Anxiety Difficulty Somewhat difficult Not difficult at all       History Natalie Valencia has a past medical history of Allergy, Asthma, Back pain, GERD (gastroesophageal reflux disease), Hyperlipidemia, Hypertension, Hypothyroidism, Osteopenia, and Thyroid disease.   She has a past surgical history that includes Abdominal hysterectomy (2001); Nasal sinus surgery; Colonoscopy; Upper gastrointestinal endoscopy; Dilation and curettage of uterus; and Kyphoplasty (N/A, 03/22/2021).   Her family history includes Alcoholism in her maternal grandfather; Cancer in her mother; Diabetes in her father; Stroke in her paternal grandmother.She reports that she has never smoked. She has never used smokeless tobacco. She reports that she does not drink alcohol and does not use drugs.  Current Outpatient Medications on File Prior to Visit  Medication Sig Dispense Refill   acetaminophen (TYLENOL) 500 MG tablet Take 1,000 mg by mouth every 8 (eight) hours  as needed for moderate pain.     docusate sodium (COLACE) 100 MG capsule Take 100 mg by mouth as needed for mild constipation.     fluticasone-salmeterol (ADVAIR DISKUS) 500-50 MCG/ACT AEPB Inhale 1 puff into the lungs in the morning and at bedtime. 60 each 11   levothyroxine (SYNTHROID) 50 MCG tablet Take 1 tablet (50 mcg total) by mouth daily. 90 tablet 3   pantoprazole (PROTONIX) 40 MG tablet Take 1 tablet (40 mg total) by mouth daily. 90 tablet 3   raloxifene (EVISTA) 60 MG tablet Take 1 tablet (60 mg total) by mouth daily. For bone health 90 tablet 3   rosuvastatin (CRESTOR) 5 MG tablet Take 1 tablet (5 mg total) by mouth daily. For cholesterol 90 tablet 3   vitamin B-12 (CYANOCOBALAMIN) 500 MCG tablet Take 1,000 mcg by mouth daily.     Current Facility-Administered Medications on File Prior to Visit  Medication Dose Route Frequency Provider Last Rate Last Admin   zoledronic acid (RECLAST) injection 5 mg  5 mg Intravenous Once Mechele Claude, MD        ROS Review of Systems  Constitutional: Negative.   HENT: Negative.    Eyes:  Negative for visual disturbance.  Respiratory:  Negative for shortness of breath.   Cardiovascular:  Negative for chest pain.  Gastrointestinal:  Negative for abdominal pain.  Musculoskeletal:  Negative for arthralgias.    Objective:  BP 139/70   Pulse 72   Temp 97.6 F (36.4 C)   Ht 5\' 8"  (  1.727 m)   Wt 162 lb (73.5 kg)   SpO2 93%   BMI 24.63 kg/m   BP Readings from Last 3 Encounters:  07/29/23 139/70  05/31/23 134/68  04/18/23 111/60    Wt Readings from Last 3 Encounters:  07/29/23 162 lb (73.5 kg)  04/18/23 154 lb 9.6 oz (70.1 kg)  01/23/23 162 lb (73.5 kg)     Physical Exam Constitutional:      General: She is not in acute distress.    Appearance: She is well-developed.  Cardiovascular:     Rate and Rhythm: Normal rate and regular rhythm.  Pulmonary:     Breath sounds: Normal breath sounds.  Musculoskeletal:        General:  Normal range of motion.  Skin:    General: Skin is warm and dry.  Neurological:     Mental Status: She is alert and oriented to person, place, and time.       Assessment & Plan:   Natalie Valencia was seen today for medical management of chronic issues.  Diagnoses and all orders for this visit:  Acquired hypothyroidism -     TSH + free T4  Hyperlipidemia with target LDL less than 100 -     Lipid panel  Essential hypertension, benign -     CBC with Differential/Platelet -     CMP14+EGFR  Other orders -     albuterol (VENTOLIN HFA) 108 (90 Base) MCG/ACT inhaler; Inhale 2 puffs into the lungs every 4 (four) hours as needed for wheezing or shortness of breath.   Allergies as of 07/29/2023   No Known Allergies      Medication List        Accurate as of July 29, 2023  9:08 AM. If you have any questions, ask your nurse or doctor.          acetaminophen 500 MG tablet Commonly known as: TYLENOL Take 1,000 mg by mouth every 8 (eight) hours as needed for moderate pain.   albuterol 108 (90 Base) MCG/ACT inhaler Commonly known as: VENTOLIN HFA Inhale 2 puffs into the lungs every 4 (four) hours as needed for wheezing or shortness of breath.   cyanocobalamin 500 MCG tablet Commonly known as: VITAMIN B12 Take 1,000 mcg by mouth daily.   docusate sodium 100 MG capsule Commonly known as: COLACE Take 100 mg by mouth as needed for mild constipation.   fluticasone-salmeterol 500-50 MCG/ACT Aepb Commonly known as: Advair Diskus Inhale 1 puff into the lungs in the morning and at bedtime.   levothyroxine 50 MCG tablet Commonly known as: SYNTHROID Take 1 tablet (50 mcg total) by mouth daily.   pantoprazole 40 MG tablet Commonly known as: PROTONIX Take 1 tablet (40 mg total) by mouth daily.   raloxifene 60 MG tablet Commonly known as: Evista Take 1 tablet (60 mg total) by mouth daily. For bone health   rosuvastatin 5 MG tablet Commonly known as: Crestor Take 1 tablet (5  mg total) by mouth daily. For cholesterol        Meds ordered this encounter  Medications   albuterol (VENTOLIN HFA) 108 (90 Base) MCG/ACT inhaler    Sig: Inhale 2 puffs into the lungs every 4 (four) hours as needed for wheezing or shortness of breath.    Dispense:  8 g    Refill:  11      Follow-up: Return in about 6 months (around 01/26/2024) for [facility]ete physical.  Mechele Claude, M.D.

## 2023-07-30 LAB — CMP14+EGFR
ALT: 16 IU/L (ref 0–32)
AST: 19 IU/L (ref 0–40)
Albumin: 4.2 g/dL (ref 3.9–4.9)
Alkaline Phosphatase: 55 IU/L (ref 44–121)
BUN/Creatinine Ratio: 13 (ref 12–28)
BUN: 12 mg/dL (ref 8–27)
Bilirubin Total: 0.2 mg/dL (ref 0.0–1.2)
CO2: 23 mmol/L (ref 20–29)
Calcium: 9.3 mg/dL (ref 8.7–10.3)
Chloride: 105 mmol/L (ref 96–106)
Creatinine, Ser: 0.89 mg/dL (ref 0.57–1.00)
Globulin, Total: 2.5 g/dL (ref 1.5–4.5)
Glucose: 95 mg/dL (ref 70–99)
Potassium: 4.4 mmol/L (ref 3.5–5.2)
Sodium: 142 mmol/L (ref 134–144)
Total Protein: 6.7 g/dL (ref 6.0–8.5)
eGFR: 71 mL/min/{1.73_m2} (ref >59)

## 2023-07-30 LAB — CBC WITH DIFFERENTIAL/PLATELET
Basophils Absolute: 0.1 10*3/uL (ref 0.0–0.2)
Basos: 2 %
EOS (ABSOLUTE): 2.3 10*3/uL — ABNORMAL HIGH (ref 0.0–0.4)
Eos: 31 %
Hematocrit: 43.8 % (ref 34.0–46.6)
Hemoglobin: 14 g/dL (ref 11.1–15.9)
Immature Grans (Abs): 0 10*3/uL (ref 0.0–0.1)
Immature Granulocytes: 0 %
Lymphocytes Absolute: 1.9 10*3/uL (ref 0.7–3.1)
Lymphs: 26 %
MCH: 29.7 pg (ref 26.6–33.0)
MCHC: 32 g/dL (ref 31.5–35.7)
MCV: 93 fL (ref 79–97)
Monocytes Absolute: 0.5 10*3/uL (ref 0.1–0.9)
Monocytes: 7 %
Neutrophils Absolute: 2.6 10*3/uL (ref 1.4–7.0)
Neutrophils: 34 %
Platelets: 330 10*3/uL (ref 150–450)
RBC: 4.71 x10E6/uL (ref 3.77–5.28)
RDW: 12.5 % (ref 11.7–15.4)
WBC: 7.5 10*3/uL (ref 3.4–10.8)

## 2023-07-30 LAB — LIPID PANEL
Cholesterol, Total: 188 mg/dL (ref 100–199)
HDL: 99 mg/dL (ref >39)
LDL CALC COMMENT:: 1.9 ratio (ref 0.0–4.4)
LDL Chol Calc (NIH): 74 mg/dL (ref 0–99)
Triglycerides: 87 mg/dL (ref 0–149)
VLDL Cholesterol Cal: 15 mg/dL (ref 5–40)

## 2023-07-30 LAB — TSH+FREE T4
Free T4: 1.36 ng/dL (ref 0.82–1.77)
TSH: 2.58 u[IU]/mL (ref 0.450–4.500)

## 2023-07-30 NOTE — Progress Notes (Signed)
Hello Kynlee,  Your lab result is normal and/or stable.Some minor variations that are not significant are commonly marked abnormal, but do not represent any medical problem for you.  Best regards, Freddie Dymek, M.D.

## 2023-08-19 DIAGNOSIS — H5213 Myopia, bilateral: Secondary | ICD-10-CM | POA: Diagnosis not present

## 2023-09-24 ENCOUNTER — Other Ambulatory Visit: Payer: Self-pay | Admitting: Family Medicine

## 2023-09-24 DIAGNOSIS — Z1231 Encounter for screening mammogram for malignant neoplasm of breast: Secondary | ICD-10-CM

## 2023-10-04 DIAGNOSIS — R059 Cough, unspecified: Secondary | ICD-10-CM | POA: Diagnosis not present

## 2023-10-04 DIAGNOSIS — B9689 Other specified bacterial agents as the cause of diseases classified elsewhere: Secondary | ICD-10-CM | POA: Diagnosis not present

## 2023-10-04 DIAGNOSIS — R062 Wheezing: Secondary | ICD-10-CM | POA: Diagnosis not present

## 2023-10-04 DIAGNOSIS — J988 Other specified respiratory disorders: Secondary | ICD-10-CM | POA: Diagnosis not present

## 2023-10-07 ENCOUNTER — Ambulatory Visit
Admission: RE | Admit: 2023-10-07 | Discharge: 2023-10-07 | Disposition: A | Discharge status: Home / Self Care | Payer: Medicare Other | Source: Ambulatory Visit | Attending: Family Medicine | Admitting: Family Medicine

## 2023-10-07 DIAGNOSIS — Z1231 Encounter for screening mammogram for malignant neoplasm of breast: Secondary | ICD-10-CM | POA: Diagnosis not present

## 2023-10-17 ENCOUNTER — Telehealth: Payer: Self-pay | Admitting: Family Medicine

## 2023-10-17 NOTE — Telephone Encounter (Signed)
Left message for pt to call back. We can schedule her for a video visit today with Jannifer Rodney or Richardson Landry or we can schedule her for in person tomorrow with one of the providers who has an opening. Doesn't have to be with Dr Darlyn Read, for this type of visit.   Copied from CRM [id number]. Topic: Appointments - Scheduling Inquiry for Clinic >> Oct 17, 2023  8:32 AM Natalie Valencia wrote: Reason for CRM: Patient is expericince pain in right knee when walking down the stairs or if she turns a certain way//She would like an appointment today or tomorrow/ Is there anyway possible the office can fit her in today//

## 2023-10-18 ENCOUNTER — Encounter: Payer: Self-pay | Admitting: Family Medicine

## 2023-10-18 ENCOUNTER — Ambulatory Visit (INDEPENDENT_AMBULATORY_CARE_PROVIDER_SITE_OTHER): Payer: Medicare Other | Admitting: Family Medicine

## 2023-10-18 ENCOUNTER — Ambulatory Visit (INDEPENDENT_AMBULATORY_CARE_PROVIDER_SITE_OTHER): Payer: Medicare Other

## 2023-10-18 VITALS — BP 121/64 | HR 76 | Temp 97.9°F | Ht 68.0 in | Wt 166.2 lb

## 2023-10-18 DIAGNOSIS — M25561 Pain in right knee: Secondary | ICD-10-CM

## 2023-10-18 DIAGNOSIS — M25461 Effusion, right knee: Secondary | ICD-10-CM | POA: Diagnosis not present

## 2023-10-18 MED ORDER — MELOXICAM 15 MG PO TABS
15.0000 mg | ORAL_TABLET | Freq: Every day | ORAL | 0 refills | Status: DC
Start: 2023-10-18 — End: 2024-01-27

## 2023-10-18 NOTE — Progress Notes (Signed)
   Acute Office Visit  Subjective:     Patient ID: Natalie Valencia, female    DOB: 10-Mar-1956, 67 y.o.   MRN: [id number]  Chief Complaint  Patient presents with   Knee Pain    Knee Pain  There was no injury mechanism. The pain is present in the right knee. The quality of the pain is described as aching and stabbing. Pain scale: 0-5/10. The pain has been Fluctuating since onset. Exacerbated by: going down stairs, twisting. She has tried acetaminophen, ice, NSAIDs, rest and heat (icy hot, voltaren gel) for the symptoms. The treatment provided mild relief.  Pain is primarily at insertion side of quadriceps tendon. She is concerned about a possible fracture as she has a hx of osteoporosis and has had 3 compression fractures of her spine.  ROS As per HPI.      Objective:    BP 121/64   Pulse 76   Temp 97.9 F (36.6 C) (Temporal)   Ht 5\' 8"  (1.727 m)   Wt 166 lb 3.2 oz (75.4 kg)   SpO2 99%   BMI 25.27 kg/m    Physical Exam Vitals and nursing note reviewed.  Constitutional:      General: She is not in acute distress.    Appearance: She is not ill-appearing, toxic-appearing or diaphoretic.  Musculoskeletal:     Right knee: Swelling (mild, proximal anterior) present. No deformity, erythema, ecchymosis, bony tenderness or crepitus. Tenderness (Quadriceps tendon) present. Normal alignment and normal patellar mobility.     Instability Tests: Medial McMurray test negative and lateral McMurray test negative.  Skin:    General: Skin is warm and dry.  Neurological:     General: No focal deficit present.     Mental Status: She is alert and oriented to person, place, and time.  Psychiatric:        Mood and Affect: Mood normal.        Behavior: Behavior normal.     No results found for any visits on 10/18/23.      Assessment & Plan:   Natalie Valencia was seen today for knee pain.  Diagnoses and all orders for this visit:  Acute pain of right knee ? Quadriceps tendonitis. No fractures  or dislocations noted on Xray today. Joint spacing appears preserved. Radiology report is pending. Try mobic daily prn. Do not take other NSAIDs with this. Discussed compression, rest, elevation, stretching. Will notify patient of xray report when available and plan of care pending report.  -     DG Knee 1-2 Views Right; Future -     meloxicam (MOBIC) 15 MG tablet; Take 1 tablet (15 mg total) by mouth daily.  Return to office for new or worsening symptoms, or if symptoms persist.   The patient indicates understanding of these issues and agrees with the plan.  Gabriel Earing, FNP

## 2023-11-01 ENCOUNTER — Ambulatory Visit: Payer: Medicare Other | Admitting: Family Medicine

## 2023-11-01 ENCOUNTER — Encounter: Payer: Self-pay | Admitting: Family Medicine

## 2023-11-01 VITALS — BP 115/62 | HR 82 | Temp 98.5°F | Ht 68.0 in | Wt 164.0 lb

## 2023-11-01 DIAGNOSIS — J4541 Moderate persistent asthma with (acute) exacerbation: Secondary | ICD-10-CM | POA: Diagnosis not present

## 2023-11-01 DIAGNOSIS — J069 Acute upper respiratory infection, unspecified: Secondary | ICD-10-CM | POA: Diagnosis not present

## 2023-11-01 LAB — VERITOR FLU A/B WAIVED
Influenza A: NEGATIVE
Influenza B: NEGATIVE

## 2023-11-01 LAB — RSV AG, IMMUNOCHR, WAIVED: RSV Ag, Immunochr, Waived: NEGATIVE

## 2023-11-01 MED ORDER — TRELEGY ELLIPTA 100-62.5-25 MCG/ACT IN AEPB: 1.0000 | INHALATION_SPRAY | Freq: Every day | RESPIRATORY_TRACT | Status: DC

## 2023-11-01 MED ORDER — CEFDINIR 300 MG PO CAPS: 300.0000 mg | ORAL_CAPSULE | Freq: Two times a day (BID) | ORAL | 0 refills | Status: DC

## 2023-11-01 MED ORDER — METHYLPREDNISOLONE ACETATE 40 MG/ML IJ SUSP
40.0000 mg | Freq: Once | INTRAMUSCULAR | Status: AC
Start: 2023-11-01 — End: 2023-11-01
  Administered 2023-11-01: 40 mg via INTRAMUSCULAR

## 2023-11-01 MED ORDER — IPRATROPIUM-ALBUTEROL 0.5-2.5 (3) MG/3ML IN SOLN
3.0000 mL | Freq: Once | RESPIRATORY_TRACT | Status: AC
  Administered 2023-11-01: 3 mL via RESPIRATORY_TRACT

## 2023-11-01 MED ORDER — BENZONATATE 100 MG PO CAPS: 100.0000 mg | ORAL_CAPSULE | Freq: Three times a day (TID) | ORAL | 0 refills | Status: DC | PRN

## 2023-11-01 MED ORDER — PREDNISONE 20 MG PO TABS: 40.0000 mg | ORAL_TABLET | Freq: Every day | ORAL | 0 refills | Status: AC

## 2023-11-01 NOTE — Addendum Note (Signed)
 Addended by: Waynette Buttery on: 11/01/2023 08:57 AM   Modules accepted: Orders

## 2023-11-01 NOTE — Patient Instructions (Signed)
 Trial of Trelegy. Inhale 1 puff daily. STOP the Advair. Ok to continue the albuterol as needed Referral back to Dr Rexene Edison sent Start Prednisone tomorrow since you had the shot today. Start antibiotic today.

## 2023-11-01 NOTE — Progress Notes (Signed)
 Subjective: CC: URI PCP: Zollie Lowers, MD Natalie Valencia is a 68 y.o. female presenting to clinic today for:  1.  URI Patient reports that she had onset of rhinorrhea, nasal congestion on Sunday.  Its progressively gotten worse and is now in her lungs where she is having increased difficulty breathing, wheezing etc.  This is despite compliance with her Advair and increased use of albuterol  inhaler.  She was sick with similar a month ago.  She was previously under the care of Dr. Rosan in [place] but has not seen her for several years now.  She denies any hemoptysis, fevers, known sick contacts.   ROS: Per HPI  No Known Allergies Past Medical History:  Diagnosis Date   Allergy     Asthma    Back pain    GERD (gastroesophageal reflux disease)    Hyperlipidemia    borderline diet controlled   Hypertension    situational no meds   Hypothyroidism    Osteopenia    Thyroid  disease     Current Outpatient Medications:    acetaminophen  (TYLENOL ) 500 MG tablet, Take 1,000 mg by mouth every 8 (eight) hours as needed for moderate pain., Disp: , Rfl:    albuterol  (VENTOLIN  HFA) 108 (90 Base) MCG/ACT inhaler, Inhale 2 puffs into the lungs every 4 (four) hours as needed for wheezing or shortness of breath., Disp: 8 g, Rfl: 11   docusate sodium (COLACE) 100 MG capsule, Take 100 mg by mouth as needed for mild constipation., Disp: , Rfl:    fluticasone -salmeterol (ADVAIR DISKUS) 500-50 MCG/ACT AEPB, Inhale 1 puff into the lungs in the morning and at bedtime., Disp: 60 each, Rfl: 11   levothyroxine  (SYNTHROID ) 50 MCG tablet, Take 1 tablet (50 mcg total) by mouth daily., Disp: 90 tablet, Rfl: 3   meloxicam  (MOBIC ) 15 MG tablet, Take 1 tablet (15 mg total) by mouth daily., Disp: 30 tablet, Rfl: 0   pantoprazole  (PROTONIX ) 40 MG tablet, Take 1 tablet (40 mg total) by mouth daily., Disp: 90 tablet, Rfl: 3   raloxifene  (EVISTA ) 60 MG tablet, Take 1 tablet (60 mg total) by mouth daily. For  bone health, Disp: 90 tablet, Rfl: 3   rosuvastatin  (CRESTOR ) 5 MG tablet, Take 1 tablet (5 mg total) by mouth daily. For cholesterol, Disp: 90 tablet, Rfl: 3   vitamin B-12 (CYANOCOBALAMIN) 500 MCG tablet, Take 1,000 mcg by mouth daily., Disp: , Rfl:   Current Facility-Administered Medications:    zoledronic  acid (RECLAST ) injection 5 mg, 5 mg, Intravenous, Once, Zollie Lowers, MD Social History   Socioeconomic History   Marital status: Married    Spouse name: Not on file   Number of children: 2   Years of education: Not on file   Highest education level: GED or equivalent  Occupational History   Occupation: assembler  Tobacco Use   Smoking status: Never   Smokeless tobacco: Never  Vaping Use   Vaping status: Never Used  Substance and Sexual Activity   Alcohol use: No   Drug use: No   Sexual activity: Not on file  Other Topics Concern   Not on file  Social History Narrative   Not on file   Social Drivers of Health   Financial Resource Strain: Medium Risk (10/31/2023)   Overall Financial Resource Strain (CARDIA)    Difficulty of Paying Living Expenses: Somewhat hard  Food Insecurity: Food Insecurity Present (10/31/2023)   Hunger Vital Sign    Worried About Running Out of Food in the  Last Year: Never true    Ran Out of Food in the Last Year: Sometimes true  Transportation Needs: No Transportation Needs (10/31/2023)   PRAPARE - Administrator, Civil Service (Medical): No    Lack of Transportation (Non-Medical): No  Physical Activity: Inactive (10/31/2023)   Exercise Vital Sign    Days of Exercise per Week: 0 days    Minutes of Exercise per Session: 30 min  Stress: Stress Concern Present (10/31/2023)   [employer] of Occupational Health - Occupational Stress Questionnaire    Feeling of Stress : To some extent  Social Connections: Moderately Isolated (10/31/2023)   Social Connection and Isolation Panel [NHANES]    Frequency of Communication with Friends and  Family: Three times a week    Frequency of Social Gatherings with Friends and Family: Twice a week    Attends Religious Services: Never    Database Administrator or Organizations: No    Attends Banker Meetings: Never    Marital Status: Married  Catering Manager Violence: Not At Risk (12/19/2022)   Humiliation, Afraid, Rape, and Kick questionnaire    Fear of Current or Ex-Partner: No    Emotionally Abused: No    Physically Abused: No    Sexually Abused: No   Family History  Problem Relation Age of Onset   Cancer Mother        female region- unknown   Diabetes Father    Alcoholism Maternal Grandfather    Stroke Paternal Grandmother    Colon cancer Neg Hx    Esophageal cancer Neg Hx    Stomach cancer Neg Hx    Rectal cancer Neg Hx    Breast cancer Neg Hx     Objective: Office vital signs reviewed. BP 115/62   Pulse 82   Temp 98.5 F (36.9 C) (Temporal)   Ht 5' 8 (1.727 m)   Wt 164 lb (74.4 kg)   SpO2 93%   BMI 24.94 kg/m   Physical Examination:  General: Awake, alert, nontoxic female, No acute distress HEENT: Normal    Neck: No masses palpated.  Mild increase left anterior cervical lymph node    Ears: Tympanic membranes intact, normal light reflex, no erythema, no bulging    Eyes: PERRLA, extraocular membranes intact, sclera white    Nose: nasal turbinates moist, clear nasal discharge    Throat: moist mucus membranes, mild oropharyngeal erythema, no tonsillar exudate.  Airway is patent Cardio: regular rate and rhythm, S1S2 heard, no murmurs appreciated Pulm: Global inspiratory and expiratory wheezes noted.  Normal work of breathing on room air.   Assessment/ Plan: 68 y.o. female   Moderate persistent asthma with exacerbation - Plan: RSV Ag, Immunochr, Waived, Veritor Flu A/B Waived, Novel Coronavirus, NAA ([facility]), cefdinir  (OMNICEF ) 300 MG capsule, predniSONE  (DELTASONE ) 20 MG tablet, methylPREDNISolone  acetate (DEPO-MEDROL ) injection 40 mg,  benzonatate  (TESSALON  PERLES) 100 MG capsule, Ambulatory referral to Allergy , Fluticasone -Umeclidin-Vilant (TRELEGY ELLIPTA ) 100-62.5-25 MCG/ACT AEPB  Certainly appears to be an asthma exacerbation.  I am going to empirically treat with oral antibiotics, prednisone .  She was given a Depo-Medrol  here in office given degree of inspiratory and expiratory wheezes the did improve after DuoNeb.  I have given her a sample of Trelegy 100 mcg to try.  May need to 100 mcg but would like her to see PCP in 2 weeks for reevaluation determine response to alternative therapy.  Referred back to her asthma and allergist.  Home care instructions reviewed and reasons for  reevaluation discussed.  She voiced good understanding will follow up as needed   Natalie Valencia CHRISTELLA Fielding, DO [facility] [phone]

## 2023-11-04 LAB — NOVEL CORONAVIRUS, NAA: SARS-CoV-2, NAA: NOT DETECTED

## 2023-11-25 ENCOUNTER — Ambulatory Visit: Payer: Medicare Other | Admitting: Family Medicine

## 2023-11-26 ENCOUNTER — Encounter: Payer: Self-pay | Admitting: Family Medicine

## 2023-12-25 ENCOUNTER — Ambulatory Visit: Payer: Medicare Other

## 2023-12-25 VITALS — Ht 68.0 in | Wt 164.0 lb

## 2023-12-25 DIAGNOSIS — Z0000 Encounter for general adult medical examination without abnormal findings: Secondary | ICD-10-CM

## 2023-12-25 NOTE — Progress Notes (Signed)
 Subjective:   Natalie Valencia is a 68 y.o. who presents for a Medicare Wellness preventive visit.  Visit Complete: Virtual I connected with  [name] on 12/25/23 by a audio enabled telemedicine application and verified that I am speaking with the correct person using two identifiers.  Patient Location: Home  Provider Location: Home Office  I discussed the limitations of evaluation and management by telemedicine. The patient expressed understanding and agreed to proceed.  Vital Signs: Because this visit was a virtual/telehealth visit, some criteria may be missing or patient reported. Any vitals not documented were not able to be obtained and vitals that have been documented are patient reported.  VideoDeclined- This patient declined Librarian, academic. Therefore the visit was completed with audio only.  AWV Questionnaire: No: Patient Medicare AWV questionnaire was not completed prior to this visit.  Cardiac Risk Factors include: advanced age (>27men, >82 women);dyslipidemia;hypertension     Objective:    Today's Vitals   12/25/23 1230  Weight: 164 lb (74.4 kg)  Height: 5\' 8"  (1.727 m)   Body mass index is 24.94 kg/m.     12/25/2023   12:34 PM 12/19/2022    9:42 AM 05/05/2021   12:56 PM 03/21/2021    8:18 AM  Advanced Directives  Does Patient Have a Medical Advance Directive? No No No No  Would patient like information on creating a medical advance directive? Yes (MAU/Ambulatory/Procedural Areas - Information given) Yes (MAU/Ambulatory/Procedural Areas - Information given)  Yes (MAU/Ambulatory/Procedural Areas - Information given)    Current Medications (verified) Outpatient Encounter Medications as of 12/25/2023  Medication Sig   acetaminophen (TYLENOL) 500 MG tablet Take 1,000 mg by mouth every 8 (eight) hours as needed for moderate pain.   albuterol (VENTOLIN HFA) 108 (90 Base) MCG/ACT inhaler Inhale 2 puffs into the lungs every 4 (four)  hours as needed for wheezing or shortness of breath.   benzonatate (TESSALON PERLES) 100 MG capsule Take 1 capsule (100 mg total) by mouth 3 (three) times daily as needed for cough.   cefdinir (OMNICEF) 300 MG capsule Take 1 capsule (300 mg total) by mouth 2 (two) times daily. 1 po BID   docusate sodium (COLACE) 100 MG capsule Take 100 mg by mouth as needed for mild constipation.   fluticasone-salmeterol (ADVAIR DISKUS) 500-50 MCG/ACT AEPB Inhale 1 puff into the lungs in the morning and at bedtime.   Fluticasone-Umeclidin-Vilant (TRELEGY ELLIPTA) 100-62.5-25 MCG/ACT AEPB Inhale 1 puff into the lungs daily.   levothyroxine (SYNTHROID) 50 MCG tablet Take 1 tablet (50 mcg total) by mouth daily.   meloxicam (MOBIC) 15 MG tablet Take 1 tablet (15 mg total) by mouth daily.   pantoprazole (PROTONIX) 40 MG tablet Take 1 tablet (40 mg total) by mouth daily.   raloxifene (EVISTA) 60 MG tablet Take 1 tablet (60 mg total) by mouth daily. For bone health   rosuvastatin (CRESTOR) 5 MG tablet Take 1 tablet (5 mg total) by mouth daily. For cholesterol   vitamin B-12 (CYANOCOBALAMIN) 500 MCG tablet Take 1,000 mcg by mouth daily.   Facility-Administered Encounter Medications as of 12/25/2023  Medication   zoledronic acid (RECLAST) injection 5 mg    Allergies (verified) Patient has no known allergies.   History: Past Medical History:  Diagnosis Date   Allergy    Asthma    Back pain    GERD (gastroesophageal reflux disease)    Hyperlipidemia    borderline diet controlled   Hypertension    situational no meds  Hypothyroidism    Osteopenia    Osteoporosis    Thyroid disease    Past Surgical History:  Procedure Laterality Date   ABDOMINAL HYSTERECTOMY  2001   COLONOSCOPY     DILATION AND CURETTAGE OF UTERUS     KYPHOPLASTY N/A 03/22/2021   Procedure: KYPHOPLASTY LUMBAR ONE, LUMBAR TWO and LUMBAR THREE;  Surgeon: Venita Lick, MD;  Location: [facility] OR;  Service: Orthopedics;  Laterality: N/A;    NASAL SINUS SURGERY     UPPER GASTROINTESTINAL ENDOSCOPY     Family History  Problem Relation Age of Onset   Cancer Mother        female region- unknown   Diabetes Father    Hearing loss Father    Alcoholism Maternal Grandfather    Stroke Paternal Grandmother    Colon cancer Neg Hx    Esophageal cancer Neg Hx    Stomach cancer Neg Hx    Rectal cancer Neg Hx    Breast cancer Neg Hx    Social History   Socioeconomic History   Marital status: Married    Spouse name: Not on file   Number of children: 2   Years of education: Not on file   Highest education level: GED or equivalent  Occupational History   Occupation: Tree surgeon  Tobacco Use   Smoking status: Never   Smokeless tobacco: Never  Vaping Use   Vaping status: Never Used  Substance and Sexual Activity   Alcohol use: No   Drug use: No   Sexual activity: Not Currently    Birth control/protection: None  Other Topics Concern   Not on file  Social History Narrative   Not on file   Social Drivers of Health   Financial Resource Strain: Low Risk  (12/25/2023)   Overall Financial Resource Strain (CARDIA)    Difficulty of Paying Living Expenses: Not very hard  Recent Concern: Financial Resource Strain - Medium Risk (10/31/2023)   Overall Financial Resource Strain (CARDIA)    Difficulty of Paying Living Expenses: Somewhat hard  Food Insecurity: Food Insecurity Present (12/25/2023)   Hunger Vital Sign    Worried About Running Out of Food in the Last Year: Never true    Ran Out of Food in the Last Year: Sometimes true  Transportation Needs: No Transportation Needs (12/25/2023)   PRAPARE - Administrator, Civil Service (Medical): No    Lack of Transportation (Non-Medical): No  Physical Activity: Inactive (12/25/2023)   Exercise Vital Sign    Days of Exercise per Week: 0 days    Minutes of Exercise per Session: 0 min  Stress: No Stress Concern Present (12/25/2023)   [employer] of Occupational Health -  Occupational Stress Questionnaire    Feeling of Stress : Only a little  Recent Concern: Stress - Stress Concern Present (10/31/2023)   [employer] of Occupational Health - Occupational Stress Questionnaire    Feeling of Stress : To some extent  Social Connections: Moderately Isolated (12/25/2023)   Social Connection and Isolation Panel [NHANES]    Frequency of Communication with Friends and Family: Three times a week    Frequency of Social Gatherings with Friends and Family: Twice a week    Attends Religious Services: Never    Database administrator or Organizations: No    Attends Banker Meetings: Never    Marital Status: Married    Tobacco Counseling Counseling given: Not Answered    Clinical Intake:  Pre-visit preparation completed: Yes  Pain : No/denies pain     Diabetes: No  How often do you need to have someone help you when you read instructions, pamphlets, or other written materials from your doctor or pharmacy?: 1 - Never  Interpreter Needed?: No  Information entered by :: Kandis Fantasia LPN   Activities of Daily Living     12/24/2023    7:22 AM  In your present state of health, do you have any difficulty performing the following activities:  Hearing? 0  Vision? 0  Difficulty concentrating or making decisions? 0  Walking or climbing stairs? 0  Dressing or bathing? 0  Doing errands, shopping? 0  Preparing Food and eating ? N  Using the Toilet? N  In the past six months, have you accidently leaked urine? N  Do you have problems with loss of bowel control? N  Managing your Medications? N  Managing your Finances? N  Housekeeping or managing your Housekeeping? N    Patient Care Team: Mechele Claude, MD as PCP - General (Family Medicine) Delora Fuel, OD (Optometry)  Indicate any recent Medical Services you may have received from other than [facility] providers in the past year (date may be approximate).     Assessment:   This is a  routine wellness examination for [place].  Hearing/Vision screen Hearing Screening - Comments:: Denies hearing difficulties   Vision Screening - Comments:: Wears rx glasses - up to date with routine eye exams with [facility] [place]     Goals Addressed   None    Depression Screen     12/25/2023   12:32 PM 11/01/2023    8:35 AM 10/18/2023    9:27 AM 07/29/2023    8:08 AM 07/29/2023    8:03 AM 04/18/2023   11:41 AM 01/23/2023    8:46 AM  PHQ 2/9 Scores  PHQ - 2 Score 0 0 0 0 0 0 0  PHQ- 9 Score 2 2 0 2  2     Fall Risk     12/24/2023    7:22 AM 10/18/2023    9:27 AM 07/29/2023    8:03 AM 04/18/2023   11:41 AM 01/23/2023    8:46 AM  Fall Risk   Falls in the past year? 0 0 0 0 0  Number falls in past yr: 0      Injury with Fall? 0      Risk for fall due to : No Fall Risks   No Fall Risks   Follow up Falls prevention discussed;Education provided;Falls evaluation completed   Falls evaluation completed     MEDICARE RISK AT HOME:  Medicare Risk at Home Any stairs in or around the home?: (Patient-Rptd) Yes If so, are there any without handrails?: (Patient-Rptd) No Home free of loose throw rugs in walkways, pet beds, electrical cords, etc?: (Patient-Rptd) No Adequate lighting in your home to reduce risk of falls?: (Patient-Rptd) Yes Life alert?: (Patient-Rptd) No Use of a cane, walker or w/c?: (Patient-Rptd) No Grab bars in the bathroom?: (Patient-Rptd) No Shower chair or bench in shower?: (Patient-Rptd) Yes Elevated toilet seat or a handicapped toilet?: (Patient-Rptd) No  TIMED UP AND GO:  Was the test performed?  No  Cognitive Function: 6CIT completed        12/25/2023   12:34 PM 12/19/2022   10:17 AM  6CIT Screen  What Year? 0 points 0 points  What month? 0 points 0 points  What time? 0 points 0 points  Count back from 20 0 points 0  points  Months in reverse 0 points 0 points  Repeat phrase 0 points 0 points  Total Score 0 points 0 points     Immunizations Immunization History  Administered Date(s) Administered   Influenza,inj,Quad PF,6+ Mos 11/23/2016, 08/28/2019   Influenza-Unspecified 08/28/2019   PNEUMOCOCCAL CONJUGATE-20 07/27/2021   Pneumococcal Polysaccharide-23 11/23/2016   Tdap 06/20/2018   Zoster Recombinant(Shingrix) 03/21/2017, 06/21/2017    Screening Tests Health Maintenance  Topic Date Due   COVID-19 Vaccine (1 - 2024-25 season) Never done   INFLUENZA VACCINE  01/27/2024 (Originally 05/30/2023)   DEXA SCAN  01/26/2024   Medicare Annual Wellness (AWV)  12/24/2024   MAMMOGRAM  10/06/2025   Colonoscopy  03/21/2028   DTaP/Tdap/Td (2 - Td or Tdap) 06/20/2028   Pneumonia Vaccine 87+ Years old  Completed   Hepatitis C Screening  Completed   Zoster Vaccines- Shingrix  Completed   HPV VACCINES  Aged Out    Health Maintenance  Health Maintenance Due  Topic Date Due   COVID-19 Vaccine (1 - 2024-25 season) Never done    Additional Screening:  Vision Screening: Recommended annual ophthalmology exams for early detection of glaucoma and other disorders of the eye.  Dental Screening: Recommended annual dental exams for proper oral hygiene  Community Resource Referral / Chronic Care Management: CRR required this visit?  No   CCM required this visit?  No     Plan:     I have personally reviewed and noted the following in the patient's chart:   Medical and social history Use of alcohol, tobacco or illicit drugs  Current medications and supplements including opioid prescriptions. Patient is not currently taking opioid prescriptions. Functional ability and status Nutritional status Physical activity Advanced directives List of other physicians Hospitalizations, surgeries, and ER visits in previous 12 months Vitals Screenings to include cognitive, depression, and falls Referrals and appointments  In addition, I have reviewed and discussed with patient certain preventive protocols, quality  metrics, and best practice recommendations. A written personalized care plan for preventive services as well as general preventive health recommendations were provided to patient.     Kandis Fantasia [place], [place]   1/61/0960   After Visit Summary: (MyChart) Due to this being a telephonic visit, the after visit summary with patients personalized plan was offered to patient via MyChart   Notes: Please refer to Routing Comments.

## 2023-12-25 NOTE — Patient Instructions (Signed)
 Natalie Valencia , Thank you for taking time to come for your Medicare Wellness Visit. I appreciate your ongoing commitment to your health goals. Please review the following plan we discussed and let me know if I can assist you in the future.   Referrals/Orders/Follow-Ups/Clinician Recommendations: Aim for 30 minutes of exercise or brisk walking, 6-8 glasses of water, and 5 servings of fruits and vegetables each day.  This is a list of the screening recommended for you and due dates:  Health Maintenance  Topic Date Due   COVID-19 Vaccine (1 - 2024-25 season) Never done   Flu Shot  01/27/2024*   DEXA scan (bone density measurement)  01/26/2024   Medicare Annual Wellness Visit  12/24/2024   Mammogram  10/06/2025   Colon Cancer Screening  03/21/2028   DTaP/Tdap/Td vaccine (2 - Td or Tdap) 06/20/2028   Pneumonia Vaccine  Completed   Hepatitis C Screening  Completed   Zoster (Shingles) Vaccine  Completed   HPV Vaccine  Aged Out  *Topic was postponed. The date shown is not the original due date.    Advanced directives: (ACP Link)Information on Advanced Care Planning can be found at [facility] [place] Advance Health Care Directives Advance Health Care Directives ([email or web address]   Next Medicare Annual Wellness Visit scheduled for next year: Yes

## 2024-01-27 ENCOUNTER — Encounter: Payer: Self-pay | Admitting: Family Medicine

## 2024-01-27 ENCOUNTER — Ambulatory Visit (INDEPENDENT_AMBULATORY_CARE_PROVIDER_SITE_OTHER)

## 2024-01-27 ENCOUNTER — Ambulatory Visit (INDEPENDENT_AMBULATORY_CARE_PROVIDER_SITE_OTHER): Payer: Medicare Other | Admitting: Family Medicine

## 2024-01-27 ENCOUNTER — Telehealth: Payer: Self-pay | Admitting: Family Medicine

## 2024-01-27 VITALS — BP 119/68 | HR 69 | Temp 97.8°F | Ht 68.0 in | Wt 161.0 lb

## 2024-01-27 DIAGNOSIS — Z780 Asymptomatic menopausal state: Secondary | ICD-10-CM | POA: Diagnosis not present

## 2024-01-27 DIAGNOSIS — K2970 Gastritis, unspecified, without bleeding: Secondary | ICD-10-CM

## 2024-01-27 DIAGNOSIS — E785 Hyperlipidemia, unspecified: Secondary | ICD-10-CM

## 2024-01-27 DIAGNOSIS — I10 Essential (primary) hypertension: Secondary | ICD-10-CM

## 2024-01-27 DIAGNOSIS — E039 Hypothyroidism, unspecified: Secondary | ICD-10-CM | POA: Diagnosis not present

## 2024-01-27 DIAGNOSIS — K2990 Gastroduodenitis, unspecified, without bleeding: Secondary | ICD-10-CM

## 2024-01-27 DIAGNOSIS — Z0001 Encounter for general adult medical examination with abnormal findings: Secondary | ICD-10-CM | POA: Diagnosis not present

## 2024-01-27 DIAGNOSIS — Z0000 Encounter for general adult medical examination without abnormal findings: Secondary | ICD-10-CM

## 2024-01-27 DIAGNOSIS — M810 Age-related osteoporosis without current pathological fracture: Secondary | ICD-10-CM

## 2024-01-27 DIAGNOSIS — J45909 Unspecified asthma, uncomplicated: Secondary | ICD-10-CM

## 2024-01-27 LAB — LIPID PANEL

## 2024-01-27 MED ORDER — RALOXIFENE HCL 60 MG PO TABS
60.0000 mg | ORAL_TABLET | Freq: Every day | ORAL | 3 refills | Status: AC
Start: 2024-01-27 — End: ?

## 2024-01-27 MED ORDER — ROSUVASTATIN CALCIUM 5 MG PO TABS: 5.0000 mg | ORAL_TABLET | Freq: Every day | ORAL | 3 refills | Status: AC

## 2024-01-27 MED ORDER — PANTOPRAZOLE SODIUM 40 MG PO TBEC: 40.0000 mg | DELAYED_RELEASE_TABLET | Freq: Every day | ORAL | 3 refills | Status: AC

## 2024-01-27 MED ORDER — LEVOTHYROXINE SODIUM 50 MCG PO TABS: 50.0000 ug | ORAL_TABLET | Freq: Every day | ORAL | 3 refills | Status: AC

## 2024-01-27 NOTE — Telephone Encounter (Signed)
 [employer] faxed FMLA forms to be completed  Form Fee Paid? (Y/N)       NO////pt will pay at apt     If NO, form is placed on front office manager desk to hold until payment received. If YES, then form will be placed in the RX/HH Nurse Coordinators box for completion.  Form will not be processed until payment is received

## 2024-01-27 NOTE — Progress Notes (Signed)
 Subjective:  Patient ID: Natalie Valencia, female    DOB: [birth year]  Age: 68 y.o. MRN: [id number]  CC: Annual Exam (Declined breast exam/Agrees to dexa) and Asthma (Pollen causing asthma to act up. Turmeric and mullen heard those were good for that?)   HPI Arantza Darrington presents for CPE     12/25/2023   12:32 PM 11/01/2023    8:35 AM 10/18/2023    9:27 AM  Depression screen PHQ 2/9  Decreased Interest 0 0 0  Down, Depressed, Hopeless 0 0 0  PHQ - 2 Score 0 0 0  Altered sleeping 1 1 0  Tired, decreased energy 1 1 0  Change in appetite 0 0 0  Feeling bad or failure about yourself  0 0 0  Trouble concentrating 0 0 0  Moving slowly or fidgety/restless 0 0 0  Suicidal thoughts 0 0 0  PHQ-9 Score 2 2 0  Difficult doing work/chores   Not difficult at all    History Andreah has a past medical history of Allergy, Asthma, Back pain, GERD (gastroesophageal reflux disease), Hyperlipidemia, Hypertension, Hypothyroidism, Osteopenia, Osteoporosis, and Thyroid disease.   She has a past surgical history that includes Abdominal hysterectomy (2001); Nasal sinus surgery; Colonoscopy; Upper gastrointestinal endoscopy; Dilation and curettage of uterus; and Kyphoplasty (N/A, 03/22/2021).   Her family history includes Alcoholism in her maternal grandfather; Cancer in her mother; Diabetes in her father; Hearing loss in her father; Stroke in her paternal grandmother.She reports that she has never smoked. She has never used smokeless tobacco. She reports that she does not drink alcohol and does not use drugs.    ROS Review of Systems  Constitutional:  Positive for activity change (walking less due to breathing). Negative for appetite change, chills, diaphoresis, fatigue, fever and unexpected weight change.  HENT: Negative.  Negative for congestion, ear pain, hearing loss, postnasal drip, rhinorrhea, sneezing, sore throat and trouble swallowing.   Eyes:  Negative for pain and visual disturbance.   Respiratory:  Negative for cough, chest tightness and shortness of breath.   Cardiovascular:  Negative for chest pain and palpitations.  Gastrointestinal:  Negative for abdominal pain, constipation, diarrhea, nausea and vomiting.  Endocrine: Negative for cold intolerance, heat intolerance, polydipsia, polyphagia and polyuria.  Genitourinary:  Negative for dysuria, frequency and menstrual problem.  Musculoskeletal:  Positive for arthralgias. Negative for joint swelling.  Skin:  Negative for rash.  Allergic/Immunologic: Positive for environmental allergies (dust mites, took shots for 4 years).  Neurological:  Negative for dizziness, weakness, numbness and headaches.  Psychiatric/Behavioral:  Negative for agitation and dysphoric mood.     Objective:  BP 119/68   Pulse 69   Temp 97.8 F (36.6 C)   Ht 5\' 8"  (1.727 m)   Wt 161 lb (73 kg)   SpO2 98%   BMI 24.48 kg/m   BP Readings from Last 3 Encounters:  01/27/24 119/68  11/01/23 115/62  10/18/23 121/64    Wt Readings from Last 3 Encounters:  01/27/24 161 lb (73 kg)  12/25/23 164 lb (74.4 kg)  11/01/23 164 lb (74.4 kg)     Physical Exam   Assessment & Plan:  Well adult exam  Acquired hypothyroidism -     TSH + free T4 -     Levothyroxine Sodium; Take 1 tablet (50 mcg total) by mouth daily.  Dispense: 90 tablet; Refill: 3  Hyperlipidemia with target LDL less than 100 -     Lipid panel  Essential hypertension, benign -  CBC with Differential/Platelet -     CMP14+EGFR  Age-related osteoporosis without current pathological fracture -     Raloxifene HCl; Take 1 tablet (60 mg total) by mouth daily. For bone health  Dispense: 90 tablet; Refill: 3  Gastritis and gastroduodenitis -     Pantoprazole Sodium; Take 1 tablet (40 mg total) by mouth daily.  Dispense: 90 tablet; Refill: 3  Asthma without acute exacerbation  Postmenopausal -     DG Bone Density; Future  Other orders -     Rosuvastatin Calcium; Take 1  tablet (5 mg total) by mouth daily. For cholesterol  Dispense: 90 tablet; Refill: 3     Follow-up: Return in about 6 months (around 07/28/2024).  Mechele Claude, M.D.

## 2024-01-28 ENCOUNTER — Encounter: Payer: Self-pay | Admitting: Family Medicine

## 2024-01-28 DIAGNOSIS — Z780 Asymptomatic menopausal state: Secondary | ICD-10-CM | POA: Diagnosis not present

## 2024-01-28 DIAGNOSIS — M810 Age-related osteoporosis without current pathological fracture: Secondary | ICD-10-CM | POA: Diagnosis not present

## 2024-01-28 LAB — LIPID PANEL
Cholesterol, Total: 155 mg/dL (ref 100–199)
HDL: 76 mg/dL (ref >39)
LDL CALC COMMENT:: 2 ratio (ref 0.0–4.4)
LDL Chol Calc (NIH): 61 mg/dL (ref 0–99)
Triglycerides: 100 mg/dL (ref 0–149)
VLDL Cholesterol Cal: 18 mg/dL (ref 5–40)

## 2024-01-28 LAB — CBC WITH DIFFERENTIAL/PLATELET
Basophils Absolute: 0.1 10*3/uL (ref 0.0–0.2)
Basos: 1 %
EOS (ABSOLUTE): 2.7 10*3/uL — ABNORMAL HIGH (ref 0.0–0.4)
Eos: 31 %
Hematocrit: 42.2 % (ref 34.0–46.6)
Hemoglobin: 14 g/dL (ref 11.1–15.9)
Immature Grans (Abs): 0 10*3/uL (ref 0.0–0.1)
Immature Granulocytes: 0 %
Lymphocytes Absolute: 2 10*3/uL (ref 0.7–3.1)
Lymphs: 23 %
MCH: 29.8 pg (ref 26.6–33.0)
MCHC: 33.2 g/dL (ref 31.5–35.7)
MCV: 90 fL (ref 79–97)
Monocytes Absolute: 0.7 10*3/uL (ref 0.1–0.9)
Monocytes: 7 %
Neutrophils Absolute: 3.3 10*3/uL (ref 1.4–7.0)
Neutrophils: 38 %
Platelets: 327 10*3/uL (ref 150–450)
RBC: 4.7 x10E6/uL (ref 3.77–5.28)
RDW: 11.9 % (ref 11.7–15.4)
WBC: 8.8 10*3/uL (ref 3.4–10.8)

## 2024-01-28 LAB — CMP14+EGFR
ALT: 17 IU/L (ref 0–32)
AST: 18 IU/L (ref 0–40)
Albumin: 4.4 g/dL (ref 3.9–4.9)
Alkaline Phosphatase: 59 IU/L (ref 44–121)
BUN/Creatinine Ratio: 14 (ref 12–28)
BUN: 13 mg/dL (ref 8–27)
Bilirubin Total: 0.3 mg/dL (ref 0.0–1.2)
CO2: 24 mmol/L (ref 20–29)
Calcium: 9.5 mg/dL (ref 8.7–10.3)
Chloride: 104 mmol/L (ref 96–106)
Creatinine, Ser: 0.9 mg/dL (ref 0.57–1.00)
Globulin, Total: 2.3 g/dL (ref 1.5–4.5)
Glucose: 95 mg/dL (ref 70–99)
Potassium: 4.2 mmol/L (ref 3.5–5.2)
Sodium: 142 mmol/L (ref 134–144)
Total Protein: 6.7 g/dL (ref 6.0–8.5)
eGFR: 70 mL/min/{1.73_m2} (ref >59)

## 2024-01-28 LAB — TSH+FREE T4
Free T4: 1.33 ng/dL (ref 0.82–1.77)
TSH: 3.16 u[IU]/mL (ref 0.450–4.500)

## 2024-02-05 DIAGNOSIS — K080 Exfoliation of teeth due to systemic causes: Secondary | ICD-10-CM | POA: Diagnosis not present

## 2024-02-10 DIAGNOSIS — K080 Exfoliation of teeth due to systemic causes: Secondary | ICD-10-CM | POA: Diagnosis not present

## 2024-02-24 DIAGNOSIS — K080 Exfoliation of teeth due to systemic causes: Secondary | ICD-10-CM | POA: Diagnosis not present

## 2024-03-12 DIAGNOSIS — K080 Exfoliation of teeth due to systemic causes: Secondary | ICD-10-CM | POA: Diagnosis not present

## 2024-04-29 ENCOUNTER — Other Ambulatory Visit: Payer: Self-pay | Admitting: Family Medicine

## 2024-04-30 ENCOUNTER — Telehealth: Payer: Self-pay

## 2024-04-30 NOTE — Telephone Encounter (Signed)
 Auth Submission: NO AUTH NEEDED Site of care: Site of care: [facility] Payer: [employer] medicare Medication & CPT/J Code(s) submitted: Reclast  (Zolendronic acid) [id number] Diagnosis Code:  Route of submission (phone, fax, portal): phone Phone # Fax # Auth type: Buy/Bill PB Units/visits requested: 5mg , 1 dose Reference number: [membersID][id number] Approval from: 04/30/24 to 10/28/24

## 2024-05-02 ENCOUNTER — Telehealth: Admitting: Family Medicine

## 2024-05-02 DIAGNOSIS — J40 Bronchitis, not specified as acute or chronic: Secondary | ICD-10-CM | POA: Diagnosis not present

## 2024-05-02 MED ORDER — DOXYCYCLINE HYCLATE 100 MG PO TABS: 100.0000 mg | ORAL_TABLET | Freq: Two times a day (BID) | ORAL | 0 refills | Status: DC

## 2024-05-02 MED ORDER — BENZONATATE 100 MG PO CAPS: 100.0000 mg | ORAL_CAPSULE | Freq: Three times a day (TID) | ORAL | 0 refills | Status: AC | PRN

## 2024-05-02 NOTE — Progress Notes (Signed)

## 2024-05-05 ENCOUNTER — Other Ambulatory Visit: Payer: Self-pay | Admitting: Family Medicine

## 2024-05-05 ENCOUNTER — Ambulatory Visit: Payer: Self-pay

## 2024-05-05 DIAGNOSIS — J40 Bronchitis, not specified as acute or chronic: Secondary | ICD-10-CM

## 2024-05-05 MED ORDER — DOXYCYCLINE HYCLATE 100 MG PO TABS
100.0000 mg | ORAL_TABLET | Freq: Two times a day (BID) | ORAL | 0 refills | Status: AC
Start: 2024-05-05 — End: 2024-05-12

## 2024-05-05 NOTE — Telephone Encounter (Signed)
 Copied from CRM [id number]. Topic: Clinical - Prescription Issue >> May 05, 2024  8:40 AM Avram MATSU wrote: Reason for CRM: patient had an evisit with Kingston Robes, PA-C and was suppose to get prescribed doxycycline  (VIBRA -TABS) 100 MG tablet [[id number]]. It was sent on 05/02/24 but the pharmacy does not have the medication stated by the patient.

## 2024-05-05 NOTE — Telephone Encounter (Signed)
 FYI Only or Action Required?: FYI only for provider.  Patient was last seen in primary care on 01/27/2024 by Zollie Lowers, MD.  Called Nurse Triage reporting Sore Throat and Cough.  Symptoms began uncertain.  Interventions attempted: Prescription medications: Tessalon , doxycyline.  Symptoms are: cough, sore throat, diagnosed with bronchitis stable.  Triage Disposition: Information or Advice Only Call  Patient/caregiver understands and will follow disposition?: Yes                       Summary: sore throat / rx req   The patient shares that they have experienced a sore throat for roughly 6 days. The patient is concerned that they may be experiencing bronchitis and would like to be prescribed something to help with their symptoms. Their inhaler has been mostly ineffective. Please contact further when possible     Reason for Disposition  [1] Follow-up call to recent contact AND [2] information only call, no triage required  Answer Assessment - Initial Assessment Questions 1. REASON FOR CALL or QUESTION: What is your reason for calling today? or How can I best help you? or What question do you have that I can help answer?     Patient states she had an E vist on 05/02/24 and she was calling in today because she thought the medications for her bronchitis had not been sent in. Patient states that since calling this morning she picked up her Tessalon  and Doxycyline and does not need anything further at this time. Advised patient if her symptoms do not improve or worsen to call back.  Protocols used: Information Only Call - No Triage-A-AH

## 2024-05-08 ENCOUNTER — Other Ambulatory Visit: Payer: Self-pay

## 2024-05-13 ENCOUNTER — Ambulatory Visit: Payer: Self-pay

## 2024-05-13 ENCOUNTER — Telehealth: Admitting: Physician Assistant

## 2024-05-13 DIAGNOSIS — R0981 Nasal congestion: Secondary | ICD-10-CM

## 2024-05-13 DIAGNOSIS — R059 Cough, unspecified: Secondary | ICD-10-CM

## 2024-05-13 NOTE — Telephone Encounter (Signed)
  FYI Only or Action Required?: FYI only for provider.  Patient was last seen in primary care on 01/27/2024 by Zollie Lowers, MD.  Called Nurse Triage reporting URI and Asthma.  Symptoms began a week ago.  Interventions attempted: Prescription medications: doxycycline .  Symptoms are: some symptoms are improving, but still having cough.  Triage Disposition: See Physician Within 24 Hours  Patient/caregiver understands and will follow disposition?: Yes Copied from CRM [id number]. Topic: Clinical - Red Word Triage >> May 13, 2024  4:31 PM Susanna ORN wrote: Red Word that prompted transfer to Nurse Triage: Patient states she would like to see someone tomorrow, if possible. States she thinks she may have a sinus infection or an upper respiratory infection. Patient states she's having trouble breathing & tightness in chest. Reason for Disposition  [1] Known COPD or other severe lung disease (i.e., bronchiectasis, cystic fibrosis, lung surgery) AND [2] symptoms getting worse (i.e., increased sputum purulence or amount, increased breathing difficulty  Answer Assessment - Initial Assessment Questions 1. ONSET: When did the cough begin?      04/28/2024 2. SEVERITY: How bad is the cough today?      Moderate-severe, wakes patient up at night 3. SPUTUM: Describe the color of your sputum (e.g., none, dry cough; clear, white, yellow, green)     Milky white 4. HEMOPTYSIS: Are you coughing up any blood? If Yes, ask: How much? (e.g., flecks, streaks, tablespoons, etc.)     denies 5. DIFFICULTY BREATHING: Are you having difficulty breathing? If Yes, ask: How bad is it? (e.g., mild, moderate, severe)      Reports chest tightness 6. FEVER: Do you have a fever? If Yes, ask: What is your temperature, how was it measured, and when did it start?     denies  8. LUNG HISTORY: Do you have any history of lung disease?  (e.g., pulmonary embolus, asthma, emphysema)     asthma  10. OTHER SYMPTOMS:  Do you have any other symptoms? (e.g., runny nose, wheezing, chest pain)       Chest tightness/asthma.  Wheezing at times  Protocols used: Cough - Acute Productive-A-AH

## 2024-05-13 NOTE — Progress Notes (Signed)
  Because you are having persistent symptoms including shortness of breath despite appropriate treatment, I feel your condition warrants further evaluation and I recommend that you be seen in a face-to-face visit.   NOTE: There will be NO CHARGE for this E-Visit   If you are having a true medical emergency, please call 911.     For an urgent face to face visit, [place] has multiple urgent care centers for your convenience.  Click the link below for the full list of locations and hours, walk-in wait times, appointment scheduling options and driving directions:  Urgent Care - [place], [place], [place], [place], [place], [place]  [place]     Your MyChart E-visit questionnaire answers were reviewed by a board certified advanced clinical practitioner to complete your personal care plan based on your specific symptoms.    Thank you for using e-Visits.  Approximately 5 minutes was spent documenting and reviewing patient's chart.

## 2024-05-14 ENCOUNTER — Encounter: Payer: Self-pay | Admitting: Family Medicine

## 2024-05-14 ENCOUNTER — Ambulatory Visit

## 2024-05-14 VITALS — BP 137/68 | HR 75 | Temp 97.5°F | Ht 68.0 in | Wt 155.4 lb

## 2024-05-14 DIAGNOSIS — J069 Acute upper respiratory infection, unspecified: Secondary | ICD-10-CM | POA: Diagnosis not present

## 2024-05-14 DIAGNOSIS — R062 Wheezing: Secondary | ICD-10-CM

## 2024-05-14 MED ORDER — AMOXICILLIN-POT CLAVULANATE 875-125 MG PO TABS: 1.0000 | ORAL_TABLET | Freq: Two times a day (BID) | ORAL | 0 refills | Status: AC

## 2024-05-14 MED ORDER — PREDNISONE 20 MG PO TABS: 40.0000 mg | ORAL_TABLET | Freq: Every day | ORAL | 0 refills | Status: AC

## 2024-05-14 NOTE — Progress Notes (Signed)
 Subjective:  Patient ID: Natalie Valencia, female    DOB: [birth year], 68 y.o.   MRN: [id number]  Patient Care Team: Zollie Lowers, MD as PCP - General (Family Medicine) Vicci Mcardle, OD (Optometry)   Chief Complaint:  Shortness of Breath, Cough, and Nasal Congestion (Did evist on 7/5 and has not improved )   HPI: Natalie Valencia is a 68 y.o. female presenting on 05/14/2024 for Shortness of Breath, Cough, and Nasal Congestion (Did evist on 7/5 and has not improved )   Discussed the use of AI scribe software for clinical note transcription with the patient, who gave verbal consent to proceed.  History of Present Illness   Natalie Valencia is a 68 year old female with asthma who presents with persistent bronchitis and nasal infection symptoms.  She has been experiencing symptoms for the past ten to twelve days, including chest heaviness, persistent cough, congestion, and difficulty breathing deeply. The cough is productive of thick, sticky mucus that is difficult to expel.  She was previously prescribed an antibiotic, which she took for seven days without improvement. She uses albuterol  and Advair inhalers, but they do not provide significant relief. She has not been using Trelegy due to throat discomfort.  She manages her symptoms with Mucinex  twice daily with plenty of water, Advil Sinus Cold, and Alka-Seltzer Plus, but continues to experience nighttime coughing that disrupts her sleep.  She has not been on any steroids recently.          Relevant past medical, surgical, family, and social history reviewed and updated as indicated.  Allergies and medications reviewed and updated. Data reviewed: Chart in Epic.   Past Medical History:  Diagnosis Date   Allergy     Asthma    Back pain    GERD (gastroesophageal reflux disease)    Hyperlipidemia    borderline diet controlled   Hypertension    situational no meds   Hypothyroidism    Osteopenia    Osteoporosis    Thyroid   disease     Past Surgical History:  Procedure Laterality Date   ABDOMINAL HYSTERECTOMY  2001   COLONOSCOPY     DILATION AND CURETTAGE OF UTERUS     KYPHOPLASTY N/A 03/22/2021   Procedure: KYPHOPLASTY LUMBAR ONE, LUMBAR TWO and LUMBAR THREE;  Surgeon: Burnetta Aures, MD;  Location: [facility] OR;  Service: Orthopedics;  Laterality: N/A;   NASAL SINUS SURGERY     UPPER GASTROINTESTINAL ENDOSCOPY      Social History   Socioeconomic History   Marital status: Married    Spouse name: Not on file   Number of children: 2   Years of education: Not on file   Highest education level: GED or equivalent  Occupational History   Occupation: Tree surgeon  Tobacco Use   Smoking status: Never   Smokeless tobacco: Never  Vaping Use   Vaping status: Never Used  Substance and Sexual Activity   Alcohol use: No   Drug use: No   Sexual activity: Not Currently    Birth control/protection: None  Other Topics Concern   Not on file  Social History Narrative   Not on file   Social Drivers of Health   Financial Resource Strain: Low Risk  (12/25/2023)   Overall Financial Resource Strain (CARDIA)    Difficulty of Paying Living Expenses: Not very hard  Recent Concern: Financial Resource Strain - Medium Risk (10/31/2023)   Overall Financial Resource Strain (CARDIA)    Difficulty of Paying Living Expenses: Somewhat hard  Food Insecurity: Food Insecurity Present (12/25/2023)   Hunger Vital Sign    Worried About Running Out of Food in the Last Year: Never true    Ran Out of Food in the Last Year: Sometimes true  Transportation Needs: No Transportation Needs (12/25/2023)   PRAPARE - Administrator, Civil Service (Medical): No    Lack of Transportation (Non-Medical): No  Physical Activity: Inactive (12/25/2023)   Exercise Vital Sign    Days of Exercise per Week: 0 days    Minutes of Exercise per Session: 0 min  Stress: No Stress Concern Present (12/25/2023)   [employer] of Occupational Health -  Occupational Stress Questionnaire    Feeling of Stress : Only a little  Recent Concern: Stress - Stress Concern Present (10/31/2023)   [employer] of Occupational Health - Occupational Stress Questionnaire    Feeling of Stress : To some extent  Social Connections: Moderately Isolated (12/25/2023)   Social Connection and Isolation Panel    Frequency of Communication with Friends and Family: Three times a week    Frequency of Social Gatherings with Friends and Family: Twice a week    Attends Religious Services: Never    Database administrator or Organizations: No    Attends Banker Meetings: Never    Marital Status: Married  Catering manager Violence: Not At Risk (12/25/2023)   Humiliation, Afraid, Rape, and Kick questionnaire    Fear of Current or Ex-Partner: No    Emotionally Abused: No    Physically Abused: No    Sexually Abused: No    Outpatient Encounter Medications as of 05/14/2024  Medication Sig   acetaminophen  (TYLENOL ) 500 MG tablet Take 1,000 mg by mouth every 8 (eight) hours as needed for moderate pain.   albuterol  (VENTOLIN  HFA) 108 (90 Base) MCG/ACT inhaler Inhale 2 puffs into the lungs every 4 (four) hours as needed for wheezing or shortness of breath.   amoxicillin -clavulanate (AUGMENTIN ) 875-125 MG tablet Take 1 tablet by mouth 2 (two) times daily.   benzonatate  (TESSALON ) 100 MG capsule Take by mouth 3 (three) times daily as needed for cough.   fluticasone -salmeterol (ADVAIR DISKUS) 500-50 MCG/ACT AEPB Inhale 1 puff into the lungs in the morning and at bedtime.   levothyroxine  (SYNTHROID ) 50 MCG tablet Take 1 tablet (50 mcg total) by mouth daily.   pantoprazole  (PROTONIX ) 40 MG tablet Take 1 tablet (40 mg total) by mouth daily.   predniSONE  (DELTASONE ) 20 MG tablet Take 2 tablets (40 mg total) by mouth daily with breakfast for 5 days.   raloxifene  (EVISTA ) 60 MG tablet Take 1 tablet (60 mg total) by mouth daily. For bone health   rosuvastatin  (CRESTOR ) 5  MG tablet Take 1 tablet (5 mg total) by mouth daily. For cholesterol   vitamin B-12 (CYANOCOBALAMIN) 500 MCG tablet Take 1,000 mcg by mouth daily.   Facility-Administered Encounter Medications as of 05/14/2024  Medication   zoledronic  acid (RECLAST ) injection 5 mg    No Known Allergies  Pertinent ROS per HPI, otherwise unremarkable      Objective:  BP 137/68   Pulse 75   Temp (!) 97.5 F (36.4 C)   Ht 5' 8 (1.727 m)   Wt 155 lb 6.4 oz (70.5 kg)   SpO2 91%   BMI 23.63 kg/m    Wt Readings from Last 3 Encounters:  05/14/24 155 lb 6.4 oz (70.5 kg)  01/27/24 161 lb (73 kg)  12/25/23 164 lb (74.4 kg)  Physical Exam Vitals reviewed.  Constitutional:      General: She is not in acute distress.    Appearance: Normal appearance. She is well-developed. She is not ill-appearing, toxic-appearing or diaphoretic.  HENT:     Head: Normocephalic and atraumatic.     Right Ear: Hearing, tympanic membrane, ear canal and external ear normal.     Left Ear: Hearing, tympanic membrane, ear canal and external ear normal.     Nose: Congestion present.     Mouth/Throat:     Lips: Pink.     Mouth: Mucous membranes are moist.     Pharynx: Oropharynx is clear.  Eyes:     Conjunctiva/sclera: Conjunctivae normal.     Pupils: Pupils are equal, round, and reactive to light.  Cardiovascular:     Rate and Rhythm: Normal rate and regular rhythm.     Heart sounds: Normal heart sounds.  Pulmonary:     Effort: Pulmonary effort is normal.     Breath sounds: Wheezing present. No rhonchi.  Skin:    General: Skin is warm and dry.     Capillary Refill: Capillary refill takes less than 2 seconds.     Coloration: Skin is not cyanotic.  Neurological:     General: No focal deficit present.     Mental Status: She is alert and oriented to person, place, and time.  Psychiatric:        Mood and Affect: Mood normal.        Behavior: Behavior normal.        Thought Content: Thought content normal.         Judgment: Judgment normal.      Results for orders placed or performed in visit on 01/27/24  CBC with Differential/Platelet   Collection Time: 01/27/24 10:20 AM  Result Value Ref Range   WBC 8.8 3.4 - 10.8 x10E3/uL   RBC 4.70 3.77 - 5.28 x10E6/uL   Hemoglobin 14.0 11.1 - 15.9 g/dL   Hematocrit 57.7 65.9 - 46.6 %   MCV 90 79 - 97 fL   MCH 29.8 26.6 - 33.0 pg   MCHC 33.2 31.5 - 35.7 g/dL   RDW 88.0 88.2 - 84.5 %   Platelets 327 150 - 450 x10E3/uL   Neutrophils 38 Not Estab. %   Lymphs 23 Not Estab. %   Monocytes 7 Not Estab. %   Eos 31 Not Estab. %   Basos 1 Not Estab. %   Neutrophils Absolute 3.3 1.4 - 7.0 x10E3/uL   Lymphocytes Absolute 2.0 0.7 - 3.1 x10E3/uL   Monocytes Absolute 0.7 0.1 - 0.9 x10E3/uL   EOS (ABSOLUTE) 2.7 (H) 0.0 - 0.4 x10E3/uL   Basophils Absolute 0.1 0.0 - 0.2 x10E3/uL   Immature Granulocytes 0 Not Estab. %   Immature Grans (Abs) 0.0 0.0 - 0.1 x10E3/uL   Hematology Comments: Note:   Lipid panel   Collection Time: 01/27/24 10:20 AM  Result Value Ref Range   Cholesterol, Total 155 100 - 199 mg/dL   Triglycerides 899 0 - 149 mg/dL   HDL 76 >60 mg/dL   VLDL Cholesterol Cal 18 5 - 40 mg/dL   LDL Chol Calc (NIH) 61 0 - 99 mg/dL   Chol/HDL Ratio 2.0 0.0 - 4.4 ratio  CMP14+EGFR   Collection Time: 01/27/24 10:20 AM  Result Value Ref Range   Glucose 95 70 - 99 mg/dL   BUN 13 8 - 27 mg/dL   Creatinine, Ser 9.09 0.57 - 1.00 mg/dL   eGFR  70 >59 mL/min/1.73   BUN/Creatinine Ratio 14 12 - 28   Sodium 142 134 - 144 mmol/L   Potassium 4.2 3.5 - 5.2 mmol/L   Chloride 104 96 - 106 mmol/L   CO2 24 20 - 29 mmol/L   Calcium  9.5 8.7 - 10.3 mg/dL   Total Protein 6.7 6.0 - 8.5 g/dL   Albumin 4.4 3.9 - 4.9 g/dL   Globulin, Total 2.3 1.5 - 4.5 g/dL   Bilirubin Total 0.3 0.0 - 1.2 mg/dL   Alkaline Phosphatase 59 44 - 121 IU/L   AST 18 0 - 40 IU/L   ALT 17 0 - 32 IU/L  TSH + free T4   Collection Time: 01/27/24 10:20 AM  Result Value Ref Range   TSH 3.160 0.450 -  4.500 uIU/mL   Free T4 1.33 0.82 - 1.77 ng/dL       Pertinent labs & imaging results that were available during my care of the patient were reviewed by me and considered in my medical decision making.  Assessment & Plan:  Alylah was seen today for shortness of breath, cough and nasal congestion.  Diagnoses and all orders for this visit:  URI with cough and congestion -     predniSONE  (DELTASONE ) 20 MG tablet; Take 2 tablets (40 mg total) by mouth daily with breakfast for 5 days. -     amoxicillin -clavulanate (AUGMENTIN ) 875-125 MG tablet; Take 1 tablet by mouth 2 (two) times daily.  Wheezing -     predniSONE  (DELTASONE ) 20 MG tablet; Take 2 tablets (40 mg total) by mouth daily with breakfast for 5 days. -     amoxicillin -clavulanate (AUGMENTIN ) 875-125 MG tablet; Take 1 tablet by mouth 2 (two) times daily.      Acute Bronchitis with Asthma Exacerbation She presents with symptoms of acute bronchitis, including cough, congestion, and chest tightness, persisting for 10-12 days, accompanied by difficulty breathing deeply and thick, sticky mucus. She has asthma with current wheezing. Previous doxycycline  treatment for 7 days was ineffective. She uses albuterol  and Advair inhalers but avoids Trelegy due to throat discomfort. Suspected asthma exacerbation secondary to bronchitis. Augmentin  is added to address potential bacterial pneumonia. Steroids are considered beneficial for reducing wheezing and opening bronchial passages. Mucinex  is recommended to help break up thick mucus. - Prescribe Augmentin  to address potential bacterial pneumonia. - Prescribe oral steroids to reduce wheezing and open bronchial passages. - Continue Mucinex  twice daily with adequate hydration to help break up thick mucus. - Advise her to report any worsening or new symptoms.          Continue all other maintenance medications.  Follow up plan: Return if symptoms worsen or fail to improve.   Continue healthy  lifestyle choices, including diet (rich in fruits, vegetables, and lean proteins, and low in salt and simple carbohydrates) and exercise (at least 30 minutes of moderate physical activity daily).  Educational handout given for URI  The above assessment and management plan was discussed with the patient. The patient verbalized understanding of and has agreed to the management plan. Patient is aware to call the clinic if they develop any new symptoms or if symptoms persist or worsen. Patient is aware when to return to the clinic for a follow-up visit. Patient educated on when it is appropriate to go to the emergency department.   Rosaline Bruns, FNP-C [facility] [phone]

## 2024-05-14 NOTE — Telephone Encounter (Signed)
 Apt scheduled.

## 2024-05-15 ENCOUNTER — Other Ambulatory Visit: Payer: Self-pay

## 2024-06-05 ENCOUNTER — Encounter: Payer: Medicare Other | Attending: Family Medicine | Admitting: *Deleted

## 2024-06-05 VITALS — BP 147/72 | HR 71 | Temp 97.7°F | Resp 16

## 2024-06-05 DIAGNOSIS — M810 Age-related osteoporosis without current pathological fracture: Secondary | ICD-10-CM | POA: Diagnosis not present

## 2024-06-05 MED ORDER — DIPHENHYDRAMINE HCL 25 MG PO CAPS
25.0000 mg | ORAL_CAPSULE | Freq: Once | ORAL | Status: AC
Start: 2024-06-05 — End: 2024-06-05
  Administered 2024-06-05: 25 mg via ORAL

## 2024-06-05 MED ORDER — ZOLEDRONIC ACID 5 MG/100ML IV SOLN: 5.0000 mg | Freq: Once | INTRAVENOUS | Status: DC

## 2024-06-05 MED ORDER — ACETAMINOPHEN 325 MG PO TABS
650.0000 mg | ORAL_TABLET | Freq: Once | ORAL | Status: AC
  Administered 2024-06-05: 650 mg via ORAL

## 2024-06-05 NOTE — Progress Notes (Signed)
 Diagnosis: Osteoporosis  Provider:  Butler Der, MD  Procedure: IV Infusion  IV Type: Peripheral, IV Location: R Forearm  Reclast  (Zolendronic Acid), Dose: 5 mg  Infusion Start Time: 0930  Infusion Stop Time: 1000  Post Infusion IV Care: Observation period completed  Discharge: Condition: Good, Destination: Home . AVS Provided  Performed by:  Baldwin Darice Helling, RN

## 2024-06-11 ENCOUNTER — Telehealth: Admitting: Emergency Medicine

## 2024-06-11 DIAGNOSIS — J069 Acute upper respiratory infection, unspecified: Secondary | ICD-10-CM

## 2024-06-11 NOTE — Progress Notes (Signed)
  Because you are having some trouble breathing, I feel your condition warrants further evaluation and I recommend that you be seen in a face-to-face visit. I am very limited in what I can manage evisit and your situation is more complicated than I can handle.    NOTE: There will be NO CHARGE for this E-Visit   If you are having a true medical emergency, please call 911.     For an urgent face to face visit, [facility] has multiple urgent care centers for your convenience.  Click the link below for the full list of locations and hours, walk-in wait times, appointment scheduling options and driving directions:  Urgent Care - [place], [place], [place], [place], [place], [place]  [place]     Your MyChart E-visit questionnaire answers were reviewed by a board certified advanced clinical practitioner to complete your personal care plan based on your specific symptoms.    Thank you for using e-Visits.

## 2024-06-15 DIAGNOSIS — R062 Wheezing: Secondary | ICD-10-CM | POA: Diagnosis not present

## 2024-06-15 DIAGNOSIS — J0100 Acute maxillary sinusitis, unspecified: Secondary | ICD-10-CM | POA: Diagnosis not present

## 2024-06-15 DIAGNOSIS — Z8709 Personal history of other diseases of the respiratory system: Secondary | ICD-10-CM | POA: Diagnosis not present

## 2024-06-15 DIAGNOSIS — J988 Other specified respiratory disorders: Secondary | ICD-10-CM | POA: Diagnosis not present

## 2024-07-27 ENCOUNTER — Ambulatory Visit: Admitting: Family Medicine

## 2024-07-27 ENCOUNTER — Encounter: Payer: Self-pay | Admitting: Family Medicine

## 2024-07-27 VITALS — BP 125/71 | HR 76 | Temp 97.8°F | Ht 68.0 in | Wt 155.0 lb

## 2024-07-27 DIAGNOSIS — E785 Hyperlipidemia, unspecified: Secondary | ICD-10-CM | POA: Diagnosis not present

## 2024-07-27 DIAGNOSIS — I10 Essential (primary) hypertension: Secondary | ICD-10-CM | POA: Diagnosis not present

## 2024-07-27 DIAGNOSIS — E039 Hypothyroidism, unspecified: Secondary | ICD-10-CM

## 2024-07-27 MED ORDER — AZELASTINE HCL 0.1 % NA SOLN: 2.0000 | Freq: Two times a day (BID) | NASAL | 12 refills | Status: AC

## 2024-07-27 MED ORDER — ALBUTEROL SULFATE HFA 108 (90 BASE) MCG/ACT IN AERS: 2.0000 | INHALATION_SPRAY | RESPIRATORY_TRACT | 11 refills | Status: AC | PRN

## 2024-07-27 NOTE — Progress Notes (Signed)
 Subjective:  Patient ID: Natalie Valencia, female    DOB: 1956-01-06  Age: 68 y.o. MRN: [id number]  CC: Medical Management of Chronic Issues   HPI  Discussed the use of AI scribe software for clinical note transcription with the patient, who gave verbal consent to proceed.  History of Present Illness Natalie Valencia is a 68 year old female who presents for a six-month checkup to monitor her thyroid , reflux, and cholesterol levels.  She experiences recurrent sinus infections with mucus accumulation in her throat, leading to constant coughing and throat clearing. She reports that Zyrtec  seems to be helping, though she still has occasional symptoms. She uses Flonase  nasal spray.  She has a history of asthma and uses an albuterol  inhaler as needed. She also uses Advair twice daily and reports consistent use. She mentions needing a refill for her inhaler.  She experiences joint pain, particularly in her hands, with stiffness and nodules, which she attributes to arthritis. She uses Voltaren  gel for relief and occasionally soaks her hands in warm water with Epsom salts. No significant swelling or joint pains beyond those in her hands.  She is retired and spends her time caring for her three-year-old grandchild during the week while her son works out of town and her daughter-in-law works as a Public relations account executive.      follow-up on  thyroid . The patient has a history of hypothyroidism for many years. It has been stable recently. Pt. denies any change in  voice, loss of hair, heat or cold intolerance. Energy level has been adequate to good. Patient denies constipation and diarrhea. No myxedema. Medication is as noted below. Verified that pt is taking it daily on an empty stomach. Well tolerated.   in for follow-up of elevated cholesterol. Doing well without complaints on current medication. Denies side effects of statin including myalgia and arthralgia and nausea. Currently no chest pain, shortness of  breath or other cardiovascular related symptoms noted.  Patient in for follow-up of GERD. Currently asymptomatic taking  PPI daily. There is no chest pain or heartburn. No hematemesis and no melena. No dysphagia or choking. Onset is remote. Progression is stable. Complicating factors, none.      07/27/2024    8:50 AM 06/05/2024    8:57 AM 05/14/2024   10:52 AM  Depression screen PHQ 2/9  Decreased Interest 0 1 0  Down, Depressed, Hopeless 0 1 0  PHQ - 2 Score 0 2 0  Altered sleeping 1 0 1  Tired, decreased energy 1 0 1  Change in appetite 0 0 0  Feeling bad or failure about yourself  0 0 0  Trouble concentrating 0 0 0  Moving slowly or fidgety/restless 0  0  Suicidal thoughts 0 0 0  PHQ-9 Score 2 2 2   Difficult doing work/chores Not difficult at all Not difficult at all Somewhat difficult    History Natalie Valencia has a past medical history of Allergy , Asthma, Back pain, GERD (gastroesophageal reflux disease), Hyperlipidemia, Hypertension, Hypothyroidism, Osteopenia, Osteoporosis, and Thyroid  disease.   She has a past surgical history that includes Abdominal hysterectomy (2001); Nasal sinus surgery; Colonoscopy; Upper gastrointestinal endoscopy; Dilation and curettage of uterus; Kyphoplasty (N/A, 03/22/2021); and Fracture surgery.   Her family history includes Alcoholism in her maternal grandfather; Cancer in her mother; Diabetes in her father; Hearing loss in her father; Stroke in her paternal grandmother.She reports that she has never smoked. She has never used smokeless tobacco. She reports that she does not drink alcohol and does  not use drugs.    ROS Review of Systems  Constitutional: Negative.   HENT: Negative.    Eyes:  Negative for visual disturbance.  Respiratory:  Negative for shortness of breath.   Cardiovascular:  Negative for chest pain.  Gastrointestinal:  Negative for abdominal pain.  Musculoskeletal:  Positive for arthralgias.    Objective:  BP 125/71   Pulse 76    Temp 97.8 F (36.6 C)   Ht 5' 8 (1.727 m)   Wt 155 lb (70.3 kg)   SpO2 96%   BMI 23.57 kg/m   BP Readings from Last 3 Encounters:  07/27/24 125/71  06/05/24 (!) 147/72  05/14/24 137/68    Wt Readings from Last 3 Encounters:  07/27/24 155 lb (70.3 kg)  05/14/24 155 lb 6.4 oz (70.5 kg)  01/27/24 161 lb (73 kg)     Physical Exam Physical Exam GENERAL: Alert, cooperative, well developed, no acute distress. HEENT: Normocephalic, normal oropharynx, moist mucous membranes. CHEST: Clear to auscultation bilaterally, no wheezes, rhonchi, or crackles. CARDIOVASCULAR: Normal heart rate and rhythm, S1 and S2 normal without murmurs. ABDOMEN: Soft, non-tender, non-distended, without organomegaly, normal bowel sounds. EXTREMITIES: No cyanosis or edema. NEUROLOGICAL: Cranial nerves grossly intact, moves all extremities without gross motor or sensory deficit.   Assessment & Plan:  Hyperlipidemia with target LDL less than 100 -     Lipid panel  Acquired hypothyroidism -     TSH + free T4  Essential hypertension, benign -     CMP14+EGFR -     CBC with Differential/Platelet  Other orders -     Azelastine HCl; Place 2 sprays into both nostrils 2 (two) times daily. Use in each nostril as directed  Dispense: 30 mL; Refill: 12 -     Albuterol  Sulfate HFA; Inhale 2 puffs into the lungs every 4 (four) hours as needed for wheezing or shortness of breath.  Dispense: 8 g; Refill: 11    Assessment and Plan Assessment & Plan Asthma   She uses Advair twice daily and reports good energy levels but tires easily, possibly due to asthma. Refill albuterol  inhaler and continue Advair. Consider a daily inhaler if shortness of breath persists.  Allergic rhinitis with chronic sinus symptoms   She experiences sinus infections, mucus in the throat, and coughing. Currently using Zyrtec  and Flonase . Azelastine nasal spray is preferred on her insurance, but the combination with Flonase  is not covered.  Prescribe azelastine nasal spray and continue Flonase . Consider Allegra D 24 for additional relief.  Acquired hypothyroidism   Her energy levels are stable, but she reports slowing down and tiring more easily. No significant concerns regarding thyroid  function. Order blood work to assess thyroid  function.  Osteoarthritis of hand   She experiences joint pain and stiffness in her hands, particularly in the morning. Using Voltaren  gel provides some relief. Consider Tylenol  two extra strength three times daily and Osteo Bi-Flex for arthritis relief. Use heat for stiffness relief.  Hyperlipidemia   Blood work is needed to assess current lipid levels. Order blood work to assess lipid levels.  General Health Maintenance   Discussed the importance of a head-to-toe physical examination. [employer] may cover this under [employer]. Schedule a head-to-toe physical examination in six months.       Follow-up: No follow-ups on file.  Butler Der, M.D.

## 2024-07-28 ENCOUNTER — Ambulatory Visit: Payer: Self-pay | Admitting: Family Medicine

## 2024-07-28 LAB — CBC WITH DIFFERENTIAL/PLATELET
Basophils Absolute: 0.2 x10E3/uL (ref 0.0–0.2)
Basos: 2 %
EOS (ABSOLUTE): 2.5 x10E3/uL — ABNORMAL HIGH (ref 0.0–0.4)
Eos: 30 %
Hematocrit: 45.1 % (ref 34.0–46.6)
Hemoglobin: 14.7 g/dL (ref 11.1–15.9)
Immature Grans (Abs): 0 x10E3/uL (ref 0.0–0.1)
Immature Granulocytes: 0 %
Lymphocytes Absolute: 2.1 x10E3/uL (ref 0.7–3.1)
Lymphs: 26 %
MCH: 30.3 pg (ref 26.6–33.0)
MCHC: 32.6 g/dL (ref 31.5–35.7)
MCV: 93 fL (ref 79–97)
Monocytes Absolute: 0.7 x10E3/uL (ref 0.1–0.9)
Monocytes: 8 %
Neutrophils Absolute: 2.7 x10E3/uL (ref 1.4–7.0)
Neutrophils: 34 %
Platelets: 365 x10E3/uL (ref 150–450)
RBC: 4.85 x10E6/uL (ref 3.77–5.28)
RDW: 12.4 % (ref 11.7–15.4)
WBC: 8.1 x10E3/uL (ref 3.4–10.8)

## 2024-07-28 LAB — CMP14+EGFR
ALT: 12 IU/L (ref 0–32)
AST: 18 IU/L (ref 0–40)
Albumin: 4.5 g/dL (ref 3.9–4.9)
Alkaline Phosphatase: 57 IU/L (ref 49–135)
BUN/Creatinine Ratio: 13 (ref 12–28)
BUN: 12 mg/dL (ref 8–27)
Bilirubin Total: 0.4 mg/dL (ref 0.0–1.2)
CO2: 23 mmol/L (ref 20–29)
Calcium: 9.9 mg/dL (ref 8.7–10.3)
Chloride: 101 mmol/L (ref 96–106)
Creatinine, Ser: 0.9 mg/dL (ref 0.57–1.00)
Globulin, Total: 2.5 g/dL (ref 1.5–4.5)
Glucose: 90 mg/dL (ref 70–99)
Potassium: 4.7 mmol/L (ref 3.5–5.2)
Sodium: 139 mmol/L (ref 134–144)
Total Protein: 7 g/dL (ref 6.0–8.5)
eGFR: 70 mL/min/1.73 (ref >59)

## 2024-07-28 LAB — TSH+FREE T4
Free T4: 1.37 ng/dL (ref 0.82–1.77)
TSH: 3.59 u[IU]/mL (ref 0.450–4.500)

## 2024-07-28 LAB — LIPID PANEL
Chol/HDL Ratio: 2.1 ratio (ref 0.0–4.4)
Cholesterol, Total: 196 mg/dL (ref 100–199)
HDL: 95 mg/dL (ref >39)
LDL Chol Calc (NIH): 82 mg/dL (ref 0–99)
Triglycerides: 111 mg/dL (ref 0–149)
VLDL Cholesterol Cal: 19 mg/dL (ref 5–40)

## 2024-07-28 NOTE — Progress Notes (Signed)
Hello Kynlee,  Your lab result is normal and/or stable.Some minor variations that are not significant are commonly marked abnormal, but do not represent any medical problem for you.  Best regards, Freddie Dymek, M.D.

## 2024-08-17 DIAGNOSIS — K080 Exfoliation of teeth due to systemic causes: Secondary | ICD-10-CM | POA: Diagnosis not present

## 2024-08-21 NOTE — Progress Notes (Signed)
 Mikylah Ackroyd                                          MRN: [id number]   08/21/2024   The [facility] Quality Team Specialist reviewed this patient medical record for the purposes of chart review for care gap closure. The following were reviewed: abstraction for care gap closure-controlling blood pressure.    [facility] Quality Team

## 2024-08-31 DIAGNOSIS — H5213 Myopia, bilateral: Secondary | ICD-10-CM | POA: Diagnosis not present

## 2024-09-11 DIAGNOSIS — J0140 Acute pansinusitis, unspecified: Secondary | ICD-10-CM | POA: Diagnosis not present

## 2024-09-11 DIAGNOSIS — J40 Bronchitis, not specified as acute or chronic: Secondary | ICD-10-CM | POA: Diagnosis not present

## 2024-10-16 ENCOUNTER — Ambulatory Visit: Admitting: Family Medicine

## 2024-10-16 VITALS — BP 123/72 | HR 83 | Temp 98.3°F | Ht 68.0 in | Wt 159.0 lb

## 2024-10-16 DIAGNOSIS — J019 Acute sinusitis, unspecified: Secondary | ICD-10-CM

## 2024-10-16 DIAGNOSIS — J988 Other specified respiratory disorders: Secondary | ICD-10-CM | POA: Diagnosis not present

## 2024-10-16 DIAGNOSIS — B9689 Other specified bacterial agents as the cause of diseases classified elsewhere: Secondary | ICD-10-CM

## 2024-10-16 DIAGNOSIS — J45901 Unspecified asthma with (acute) exacerbation: Secondary | ICD-10-CM | POA: Diagnosis not present

## 2024-10-16 MED ORDER — PREDNISONE 20 MG PO TABS: 40.0000 mg | ORAL_TABLET | Freq: Every day | ORAL | 0 refills | Status: AC

## 2024-10-16 MED ORDER — AMOXICILLIN-POT CLAVULANATE 875-125 MG PO TABS: 1.0000 | ORAL_TABLET | Freq: Two times a day (BID) | ORAL | 0 refills | Status: AC

## 2024-10-16 MED ORDER — TRELEGY ELLIPTA 100-62.5-25 MCG/ACT IN AEPB: 1.0000 | INHALATION_SPRAY | Freq: Every day | RESPIRATORY_TRACT | 2 refills | Status: AC

## 2024-10-16 NOTE — Progress Notes (Signed)
 "  Acute Office Visit  Patient ID: Alesa Echevarria, female    DOB: [birth year], 68 y.o.   MRN: [id number]  PCP: Zollie Lowers, MD  Chief Complaint  Patient presents with   Sinusitis    Mucus discharge, drainage, cough, and SOB for about a week. Patient says symptoms are getting worse and she is starting to wheeze.    Subjective:     Sinusitis    Discussed the use of AI scribe software for clinical note transcription with the patient, who gave verbal consent to proceed.  History of Present Illness   Colby Catanese is a 68 year old female with asthma who presents with nasal discharge and coughing x 1 week and wheezing x 5 days.   Upper respiratory symptoms - Nasal discharge and drainage for one week, worse in the mornings - Yellow nasal discharge - Thick mucus that is difficult to expectorate, causing choking sensation - History of recurrent sinus infections, bronchitis, bacterial resp infections since July, treated with multiple courses of antibiotics and steroids. - Hx of osteoporosis.  - No fevers or chills  Asthma - Diagnosed by allergist at [facility]  - Received allergy  shots for dust mite allergy  several years ago; discontinued after negative retesting - Uses Advair twice daily and albuterol  as needed, approximately three to four times per day since being sick.  - Trialed trelegy for a few days in the past but did not like the taste in her throat from it. States that trelegy did help symptoms.   Sinus disease and nasal obstruction - History of sinus surgery several years ago for nasal polyp removal  Osteoporosis and fractures - History of osteoporosis - Sustained vertebral fractures due to coughing - Receives annual injections for osteoporosis.       ROS     Objective:    BP 123/72   Pulse 83   Temp 98.3 F (36.8 C)   Ht 5' 8 (1.727 m)   Wt 159 lb (72.1 kg)   SpO2 93%   BMI 24.18 kg/m    Physical Exam Vitals reviewed.  Constitutional:       Appearance: Normal appearance.  HENT:     Head: Normocephalic and atraumatic.     Right Ear: Tympanic membrane, ear canal and external ear normal.     Left Ear: Tympanic membrane, ear canal and external ear normal.     Nose: Nose normal.  Eyes:     Extraocular Movements: Extraocular movements intact.     Conjunctiva/sclera: Conjunctivae normal.     Pupils: Pupils are equal, round, and reactive to light.  Cardiovascular:     Rate and Rhythm: Normal rate and regular rhythm.     Pulses: Normal pulses.     Heart sounds: Normal heart sounds. No murmur heard. Pulmonary:     Effort: Pulmonary effort is normal. No respiratory distress.     Breath sounds: Wheezing present.     Comments: Global mild end expiratory wheezes Musculoskeletal:        General: No deformity. Normal range of motion.     Cervical back: Normal range of motion.  Skin:    General: Skin is warm and dry.  Neurological:     General: No focal deficit present.     Mental Status: She is alert and oriented to person, place, and time.  Psychiatric:        Mood and Affect: Mood normal.        Behavior: Behavior normal.  No results found for any visits on 10/16/24.     Assessment & Plan:   Problem List Items Addressed This Visit   None Visit Diagnoses       Acute bacterial sinusitis    -  Primary   Relevant Medications   amoxicillin -clavulanate (AUGMENTIN ) 875-125 MG tablet   predniSONE  (DELTASONE ) 20 MG tablet     Asthma with acute exacerbation, unspecified asthma severity, unspecified whether persistent       Relevant Medications   montelukast  (SINGULAIR ) 10 MG tablet   predniSONE  (DELTASONE ) 20 MG tablet   Fluticasone -Umeclidin-Vilant (TRELEGY ELLIPTA ) 100-62.5-25 MCG/ACT AEPB     Bacterial respiratory infection           Assessment and Plan    Asthma with acute exacerbation Current Advair. Concerns about repeat steroid use due to osteoporosis. - Patient voiced interest in trial of trelegy again.  Prescribed Trelegy to trial.  - Prescribed prednisone  40 mg daily for 5 days. Discussed negative effects of steroids on bone health. Discussed with patient importance of allergy  f/u with goal of them to help manage symptoms better and decrease need for systemic steroids.  - Continue albuterol  as needed - Patient to reestablish with allergist for reevaluation and likely repeat pft's.   Acute bacterial sinusitis - Prescribed Augmentin  BID x 7 days for sinusitis. - Advised follow-up with allergy  specialist for further evaluation and management.  Follow up if symptoms acutely worsen, no improvement over the next 2-3 days, fever develops, or for any other concerns. Seek immediate medical attention for any signs of respiratory distress.   F/u with pcp for further management.         Meds ordered this encounter  Medications   amoxicillin -clavulanate (AUGMENTIN ) 875-125 MG tablet    Sig: Take 1 tablet by mouth 2 (two) times daily for 7 days.    Dispense:  14 tablet    Refill:  0    Supervising Provider:   GOTTSCHALK, ASHLY M [[id number]]   predniSONE  (DELTASONE ) 20 MG tablet    Sig: Take 2 tablets (40 mg total) by mouth daily with breakfast for 5 days.    Dispense:  10 tablet    Refill:  0    Supervising Provider:   GOTTSCHALK, ASHLY M [[id number]]   Fluticasone -Umeclidin-Vilant (TRELEGY ELLIPTA ) 100-62.5-25 MCG/ACT AEPB    Sig: Inhale 1 puff into the lungs daily.    Dispense:  1 each    Refill:  2    Supervising Provider:   JOLINDA NORENE HERO [[id number]]    Return if symptoms worsen or fail to improve.  Oneil LELON Severin, FNP [facility]   "

## 2024-10-16 NOTE — Patient Instructions (Addendum)
 Follow up if symptoms acutely worsen, no improvement over the next 2-3 days, fever develops, or for any other concerns. Seek immediate medical attention for any signs of respiratory distress.

## 2024-11-06 ENCOUNTER — Telehealth: Payer: Self-pay | Admitting: Family Medicine

## 2024-11-06 NOTE — Telephone Encounter (Signed)
 Copied from CRM [id number]. Topic: Clinical - Medical Advice >> Nov 06, 2024  3:15 PM Nathanel BROCKS wrote: Reason for CRM: pt wants the dr to schedule mammogram on the mobile unit. Please advise.

## 2024-11-06 NOTE — Telephone Encounter (Signed)
 I called pt & made her an appt on 11-18-2024 w/Mammogram.

## 2024-11-12 ENCOUNTER — Other Ambulatory Visit: Payer: Self-pay | Admitting: Family Medicine

## 2024-11-12 DIAGNOSIS — Z1231 Encounter for screening mammogram for malignant neoplasm of breast: Secondary | ICD-10-CM

## 2024-11-13 ENCOUNTER — Other Ambulatory Visit: Payer: Self-pay | Admitting: Medical Genetics

## 2024-11-16 ENCOUNTER — Other Ambulatory Visit (HOSPITAL_COMMUNITY)
Admission: RE | Admit: 2024-11-16 | Discharge: 2024-11-16 | Disposition: A | Discharge status: Home / Self Care | Payer: Self-pay | Source: Ambulatory Visit | Attending: Oncology | Admitting: Oncology

## 2024-11-18 ENCOUNTER — Ambulatory Visit
Admission: RE | Admit: 2024-11-18 | Discharge: 2024-11-18 | Disposition: A | Source: Ambulatory Visit | Attending: Family Medicine | Admitting: Family Medicine

## 2024-11-18 DIAGNOSIS — Z1231 Encounter for screening mammogram for malignant neoplasm of breast: Secondary | ICD-10-CM

## 2024-11-24 LAB — GENECONNECT MOLECULAR SCREEN: Genetic Analysis Overall Interpretation: NEGATIVE

## 2024-12-25 ENCOUNTER — Ambulatory Visit: Payer: Self-pay

## 2024-12-29 ENCOUNTER — Ambulatory Visit

## 2025-01-27 ENCOUNTER — Encounter: Payer: Self-pay | Admitting: Family Medicine

## 2025-06-07 ENCOUNTER — Ambulatory Visit
